# Patient Record
Sex: Female | Born: 2000 | Race: White | Hispanic: No | Marital: Single | Smoking: Never smoker
Health system: Southern US, Community
[De-identification: ages and names within clinical notes are randomized; demographics above are authoritative.]

## PROBLEM LIST (undated history)

## (undated) DIAGNOSIS — G43909 Migraine, unspecified, not intractable, without status migrainosus: Secondary | ICD-10-CM

## (undated) DIAGNOSIS — J309 Allergic rhinitis, unspecified: Secondary | ICD-10-CM

## (undated) DIAGNOSIS — N938 Other specified abnormal uterine and vaginal bleeding: Secondary | ICD-10-CM

## (undated) DIAGNOSIS — N946 Dysmenorrhea, unspecified: Secondary | ICD-10-CM

## (undated) DIAGNOSIS — J45991 Cough variant asthma: Secondary | ICD-10-CM

## (undated) DIAGNOSIS — Z8616 Personal history of COVID-19: Secondary | ICD-10-CM

## (undated) DIAGNOSIS — J45909 Unspecified asthma, uncomplicated: Secondary | ICD-10-CM

## (undated) DIAGNOSIS — G44209 Tension-type headache, unspecified, not intractable: Secondary | ICD-10-CM

## (undated) HISTORY — PX: OTHER SURGICAL HISTORY: SHX169

## (undated) HISTORY — DX: Personal history of COVID-19: Z86.16

## (undated) HISTORY — DX: Allergic rhinitis, unspecified: J30.9

## (undated) HISTORY — DX: Dysmenorrhea, unspecified: N94.6

## (undated) HISTORY — DX: Other specified abnormal uterine and vaginal bleeding: N93.8

## (undated) HISTORY — PX: TYMPANOSTOMY TUBE PLACEMENT: SHX32

## (undated) HISTORY — DX: Tension-type headache, unspecified, not intractable: G44.209

## (undated) HISTORY — DX: Migraine, unspecified, not intractable, without status migrainosus: G43.909

## (undated) HISTORY — DX: Cough variant asthma: J45.991

---
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
## 2021-01-20 DIAGNOSIS — G43719 Chronic migraine without aura, intractable, without status migrainosus: Secondary | ICD-10-CM | POA: Diagnosis not present

## 2021-01-20 DIAGNOSIS — Z79899 Other long term (current) drug therapy: Secondary | ICD-10-CM | POA: Diagnosis not present

## 2021-01-20 DIAGNOSIS — Z049 Encounter for examination and observation for unspecified reason: Secondary | ICD-10-CM | POA: Diagnosis not present

## 2021-02-12 ENCOUNTER — Encounter: Payer: Self-pay | Admitting: Neurology

## 2021-02-12 ENCOUNTER — Other Ambulatory Visit: Payer: Self-pay

## 2021-02-12 ENCOUNTER — Ambulatory Visit: Payer: Federal, State, Local not specified - PPO | Admitting: Neurology

## 2021-02-12 VITALS — BP 121/71 | HR 75 | Ht 67.5 in | Wt 165.3 lb

## 2021-02-12 DIAGNOSIS — G43019 Migraine without aura, intractable, without status migrainosus: Secondary | ICD-10-CM

## 2021-02-12 MED ORDER — NURTEC 75 MG PO TBDP: 75.0000 mg | ORAL_TABLET | Freq: Once | ORAL | 3 refills | Status: DC | PRN

## 2021-02-12 MED ORDER — NURTEC 75 MG PO TBDP: 75.0000 mg | ORAL_TABLET | ORAL | 0 refills | Status: DC | PRN

## 2021-02-12 MED ORDER — AMITRIPTYLINE HCL 25 MG PO TABS: 25.0000 mg | ORAL_TABLET | Freq: Every day | ORAL | 3 refills | Status: DC

## 2021-02-12 NOTE — Patient Instructions (Signed)
It was nice to meet you both today.  I do believe you have migraine headaches.    Please remember, common headache triggers are: sleep deprivation, dehydration, overheating, stress, hypoglycemia or skipping meals and blood sugar fluctuations, excessive pain medications or excessive alcohol use or caffeine withdrawal. Some people have food triggers such as aged cheese, orange juice or chocolate, especially dark chocolate, or MSG (monosodium glutamate). Try to avoid these headache triggers as much possible. It may be helpful to keep a headache diary to figure out what makes your headaches worse or brings them on and what alleviates them. Some people report headache onset after exercise but studies have shown that regular exercise may actually prevent headaches from coming. If you have exercise-induced headaches, please make sure that you drink plenty of fluid before and after exercising and that you do not over do it and do not overheat.  For migraine prevention, I would like to suggest: Elavil (generic name: amitriptyline) 25 mg: Take half a pill daily at bedtime for one week, then one pill daily at bedtime for one week, then one and a half pills daily at bedtime for one week, then 2 pills daily at bedtime thereafter. Common side effects reported are: mouth dryness, drowsiness, confusion, dizziness.   Please note that most antidepressant medications have a black box warning regarding worsening depression and suicidal ideations.  Please read the insert carefully and call us with any questions.  We will consider doing a brain MRI in the future.  Your neurological exam is normal.  I do agree with seeking a formal eye examination which is pending for you for June.    For migraine abortive/acute treatment, try the Nurtec samples.  I will prescribe this medication as well:  Nurtec 75 mg strength: Take 1 pill (dissolve under the tongue) at onset of migraine headache, may repeat in 2 hours, no more than 2 pills  in 24 hours. May cause sedation and nausea.   You should be able to use a co-pay card for this to bring the price down.  Please follow-up routinely in this clinic to see one of our nurse practitioners in 3 months, call or email through MyChart with any interim questions or concerns you may have.

## 2021-02-12 NOTE — Progress Notes (Signed)
Subjective:    Patient ID: Tonya Salazar is a 20 y.o. female.  HPI     Tonya Age, Tonya Salazar, Tonya Salazar [facility] [address] [place], [place] [address]  Dear Dr. Abner Greenspan,   I saw your patient, Tonya Salazar, upon your kind request in the neurologic clinic today for consultation and second opinion of her headaches, concern for migraines.  The patient is accompanied by her mother today.  As you think Tonya Salazar is an 20 year old female with an underlying medical history of allergic rhinitis, dysmenorrhea, perioral dermatitis, who reports an approximately 75-monthhistory of recurrent headaches which are primarily located in the back of her head, left or right side.  She has associated nausea, typically no vomiting and some light sensitivity, some sound sensitivity.  Headache is throbbing and ranges in severity, is currently almost daily.  She has been taking over-the-counter ibuprofen and Excedrin, sometimes 3 at a time and almost daily.  She had side effects from both Zonegran and baclofen, baclofen made her very drowsy even half a pill of the 10 mg strength which she took in the evening, she felt drowsy the next day and missed school.  She took Zonegran which was 25 mg strength and she was advised to titrate this up to 4 pills at night in weekly increments, she only took it for a week at the 25 mg strength and felt that her headaches became worse and that she was groggy from it.  She has since then discontinued these medications which were prescribed [facility].  She has no follow-up pending at this time or canceled it.  She has made an appointment for an eye examination which is pending for June.  At Dr. FKirstie Mirzaoffice her visual acuity was 20/20.  She has no family history of migraines.  She has no history of head injuries.  She does report school related stress, tries to sleep at least 7 hours, typically in bed by 11 and rise time  is around 7.  On the weekends she has to get up at 6 to go to work.  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  In fifth grade she had an injury to her right knee as she was bicycling but did not have a head injury.  She tries to hydrate well, estimates that she drinks at least 60 ounces of water, limits her caffeine to 1 cup in the morning of coffee.  She does not drink alcohol or smoke.  She lives with her family which includes parents and 3 sisters, none with migraines.  I reviewed your office records.  She was seen in a televisit on 12/31/2020 and I reviewed the note.  She reported a 33-week history of worsening headaches with nearly daily occurrence.  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  She was started on magnesium, riboflavin and coenzyme Q 10, encouraged to hydrate well and not miss any food or snacks.  She was referred to the headache wellness center and was seen and March.  She was started on zonisamide and baclofen as needed.  I was able to review the office visit note from 01/21/1999 2010 when she saw Dr. FDomingo Cocking  She was felt to have chronic migraines without aura, intractable, facial neuralgia, chronic facial pain, cervicalgia, spasm of muscle, medication overuse headache, myalgia.  Blood work was ordered including ESR,  lipid panel, liver profile and BMP.  I do not have those blood test results available for my review.   Her Past Medical History Is Significant For: Past Medical History:  Diagnosis Date  . Asthma     Her Past Surgical History Is Significant For: No past surgical history on file.  Her Family History Is Significant For: No family history on file.  Her Social History Is Significant For: Social History   Socioeconomic History  . Marital status: Single    Spouse name: Not on file  . Number of children: Not on file  . Years of education: Not on file  . Highest education level: Not on file  Occupational  History  . Not on file  Tobacco Use  . Smoking status: Never Smoker  . Smokeless tobacco: Never Used  Vaping Use  . Vaping Use: Never used  Substance and Sexual Activity  . Alcohol use: Never  . Drug use: Never  . Sexual activity: Not on file  Other Topics Concern  . Not on file  Social History Narrative  . Not on file   Social Determinants of Health   Financial Resource Strain: Not on file  Food Insecurity: Not on file  Transportation Needs: Not on file  Physical Activity: Not on file  Stress: Not on file  Social Connections: Not on file    Her Allergies Are:  No Known Allergies:   Her Current Medications Are:  Outpatient Encounter Medications as of 02/12/2021  Medication Sig  . albuterol (PROVENTIL HFA;VENTOLIN HFA) 108 (90 BASE) MCG/ACT inhaler Inhale 1-2 puffs into the lungs every 6 (six) hours as needed for wheezing or shortness of breath.  . beclomethasone (QVAR) 40 MCG/ACT inhaler Inhale 2 puffs into the lungs 2 (two) times daily.  Marland Kitchen co-enzyme Q-10 30 MG capsule Take 30 mg by mouth 3 (three) times daily.  Marland Kitchen desogestrel-ethinyl estradiol (APRI) 0.15-30 MG-MCG tablet   . levocetirizine (XYZAL) 5 MG tablet Take 2.5 mg by mouth every evening.  Marland Kitchen MAGNESIUM PO Take by mouth.  . minocycline (MINOCIN) 50 MG capsule TAKE 1 CAPSULE(50 MG) BY MOUTH DAILY  . montelukast (SINGULAIR) 5 MG chewable tablet Chew 5 mg by mouth at bedtime.  . Pyridoxine HCl (VITAMIN B6 PO) Take by mouth.  . Minocycline HCl Micronized (AMZEEQ) 4 % FOAM Apply 1 application topically daily. (Patient not taking: Reported on 02/12/2021)  . [DISCONTINUED] cetirizine (ZYRTEC) 5 MG tablet Take 5 mg by mouth daily. (Patient not taking: Reported on 02/12/2021)  . [DISCONTINUED] metroNIDAZOLE (METROGEL) 0.75 % gel Apply 1 application topically in the morning and at bedtime. (Patient not taking: Reported on 02/12/2021)   No facility-administered encounter medications on file as of 02/12/2021.  :   Review of  Systems:  Out of a complete 14 point review of systems, all are reviewed and negative with the exception of these symptoms as listed below: Review of Systems  Neurological:       Here for consult on worsening H/a. Pt repots almost daily h/a that starts in the base of her neck and will progress to the to the back of her head and settel. Pt reports some pain is associated in the frontal area, but not often. She has been seen by the headache wellness center, and zonegran 25 mg and baclofen 10. Pt reports the zonegran did not help and the baclofen made her drowsy. Pt reports when h/a are present associated, nausea, dizziness, blurry vision, floaters, and light/noise sensitivity are associated.  Objective:  Neurological Exam  Physical Exam Physical Examination:   There were no vitals filed for this visit.  General Examination: The patient is a very pleasant 20 y.o. female in no acute distress. She appears well-developed and well-nourished and well groomed.  Currently has a mild dull throbbing headache on the right parietal and posterior area, rates it a 3 out of 10.  HEENT: Normocephalic, atraumatic, pupils are equal, round and reactive to light and accommodation.  Not particularly photophobic.  Funduscopic exam is normal with sharp disc margins noted. Extraocular tracking is good without limitation to gaze excursion or nystagmus noted. Normal smooth pursuit is noted. Hearing is grossly intact. Face is symmetric with normal facial animation and normal facial sensation. Speech is clear with no dysarthria noted. There is no hypophonia. There is no lip, neck/head, jaw or voice tremor. Neck is supple with full range of passive and active motion. There are no carotid bruits on auscultation. Oropharynx exam reveals: no signif. mouth dryness, good dental hygiene. Tongue protrudes centrally and palate elevates symmetrically.   Chest: Clear to auscultation without wheezing, rhonchi or crackles  noted.  Heart: S1+S2+0, regular and normal without murmurs, rubs or gallops noted.   Abdomen: Soft, non-tender and non-distended with normal bowel sounds appreciated on auscultation.  Extremities: There is no pitting edema in the distal lower extremities bilaterally. Pedal pulses are intact.  Skin: Warm and dry without trophic changes noted.  Musculoskeletal: exam reveals no obvious joint deformities, tenderness or joint swelling or erythema.   Neurologically:  Mental status: The patient is awake, alert and oriented in all 4 spheres. Her immediate and remote memory, attention, language skills and fund of knowledge are appropriate. There is no evidence of aphasia, agnosia, apraxia or anomia. Speech is clear with normal prosody and enunciation. Thought process is linear. Mood is normal and affect is normal.  Cranial nerves II - XII are as described above under HEENT exam. In addition: shoulder shrug is normal with equal shoulder height noted. Motor exam: Normal bulk, strength and tone is noted. There is no drift, tremor or rebound. Romberg is negative. Reflexes are 2+ throughout. Babinski: Toes are flexor bilaterally. Fine motor skills and coordination: intact with normal finger taps, normal hand movements, normal rapid alternating patting, normal foot taps and normal foot agility.  Cerebellar testing: No dysmetria or intention tremor on finger to nose testing. Heel to shin is unremarkable bilaterally. There is no truncal or gait ataxia.  Sensory exam: intact to light touch, vibration, temperature sense in the upper and lower extremities.  Gait, station and balance: She stands easily. No veering to one side is noted. No leaning to one side is noted. Posture is Salazar-appropriate and stance is narrow based. Gait shows normal stride length and normal pace. No problems turning are noted. Tandem walk is unremarkable.                Assessment and Plan:   In summary, Barbaraann Avans is a very pleasant 20  y.o.-year old female with an underlying medical history of allergic rhinitis, dysmenorrhea, perioral dermatitis, who presents for evaluation of her recurrent headaches of several months duration.  History and description are most likely in keeping with migraines without aura.  She has had a recurrent, nearly daily headache recently.  She is discouraged from using over-the-counter medication on a daily basis to avoid headache perpetuation from medication overuse.  She had recently tried Zonegran and baclofen, both caused side effects.  We talked about headache management  including triggers, preventative and acute medication options, there are very many options.  We have to be selected given that she is young and a female.  We will consider a brain MRI in the near future to have 1 at baseline.  Her neurological exam is nonfocal and I agree with evaluation with an eye doctor to start make sure her eye exam is good as well.  For preventative treatment I would like for her to start low-dose amitriptyline 25 mg strength with half a pill at bedtime.  We talked about expectations, common side effects and black box warning of antidepressants.  For as needed use, for an acute migraine attack I would like for her to try Nurtec 75 mg ODT.  She is advised how to take this medicine and the timing of it.  She can repeat after 2 hours.  She is given samples today.  She was provided detailed written instructions about both prescriptions and we talked about the importance of healthy lifestyle, good nutrition, getting enough rest, getting enough sleep.  Of note, she can continue with her supplements.  She is advised to follow-up routinely in this clinic in 3 months, sooner if needed.  She is encouraged to call us with any interim questions or concerns or email [place] through [place].  She is reassured that her neurological exam is normal.  I answered all the questions today and the patient and her mother were in agreement.  Thank you  very much for allowing me to participate in the care of this nice patient. If I can be of any further assistance to you please do not hesitate to call me at [phone].  Sincerely,   Tonya Age, Tonya Salazar, Tonya Salazar

## 2021-02-20 ENCOUNTER — Telehealth: Payer: Self-pay

## 2021-02-20 NOTE — Telephone Encounter (Signed)
PA for Nurtec has been sent to [facility].  (Key: [id number])  Your information has been submitted to [employer]. To check for an updated outcome later, reopen this PA request from your dashboard.  If [employer] has not responded to your request within 24 hours, contact [employer] at [phone]. If you think there may be a problem with your PA request, use our live chat feature at the bottom right.

## 2021-03-05 NOTE — Telephone Encounter (Signed)
Pa for nurtec has been denied due to the fact that the pt has not tired/failet 2 triptans.  Pt was given co-pay assist at the time of the visit. Will continue to use for now.

## 2021-03-19 DIAGNOSIS — U071 COVID-19: Secondary | ICD-10-CM | POA: Diagnosis not present

## 2021-03-19 DIAGNOSIS — R059 Cough, unspecified: Secondary | ICD-10-CM | POA: Diagnosis not present

## 2021-03-28 ENCOUNTER — Other Ambulatory Visit: Payer: Self-pay | Admitting: Dermatology

## 2021-04-01 ENCOUNTER — Encounter: Payer: Self-pay | Admitting: Neurology

## 2021-04-01 DIAGNOSIS — G43019 Migraine without aura, intractable, without status migrainosus: Secondary | ICD-10-CM

## 2021-04-01 MED ORDER — AMITRIPTYLINE HCL 25 MG PO TABS: ORAL_TABLET | ORAL | 3 refills | Status: DC

## 2021-04-03 ENCOUNTER — Ambulatory Visit: Payer: Federal, State, Local not specified - PPO | Admitting: Neurology

## 2021-04-23 DIAGNOSIS — Z3041 Encounter for surveillance of contraceptive pills: Secondary | ICD-10-CM | POA: Diagnosis not present

## 2021-04-23 DIAGNOSIS — N938 Other specified abnormal uterine and vaginal bleeding: Secondary | ICD-10-CM | POA: Diagnosis not present

## 2021-04-23 DIAGNOSIS — N946 Dysmenorrhea, unspecified: Secondary | ICD-10-CM | POA: Diagnosis not present

## 2021-04-23 DIAGNOSIS — Z113 Encounter for screening for infections with a predominantly sexual mode of transmission: Secondary | ICD-10-CM | POA: Diagnosis not present

## 2021-04-28 DIAGNOSIS — H53451 Other localized visual field defect, right eye: Secondary | ICD-10-CM | POA: Diagnosis not present

## 2021-04-28 DIAGNOSIS — H5212 Myopia, left eye: Secondary | ICD-10-CM | POA: Diagnosis not present

## 2021-05-06 ENCOUNTER — Other Ambulatory Visit: Payer: Self-pay

## 2021-05-06 ENCOUNTER — Encounter: Payer: Self-pay | Admitting: Adult Health

## 2021-05-06 ENCOUNTER — Ambulatory Visit: Payer: Federal, State, Local not specified - PPO | Admitting: Adult Health

## 2021-05-06 DIAGNOSIS — G43019 Migraine without aura, intractable, without status migrainosus: Secondary | ICD-10-CM | POA: Diagnosis not present

## 2021-05-06 MED ORDER — AMITRIPTYLINE HCL 50 MG PO TABS: ORAL_TABLET | ORAL | 3 refills | Status: DC

## 2021-05-06 NOTE — Progress Notes (Addendum)
PATIENT: Tonya Salazar DOB: September 20, 2001  REASON FOR VISIT: follow up HISTORY FROM: patient PRIMARY NEUROLOGIST: Dr. Rexene Alberts  HISTORY OF PRESENT ILLNESS: Today 05/06/21:  Tonya Salazar is an 20 year old female with a history of migraine headaches.  She returns today for follow-up.  At the last visit Dr. Rexene Alberts started the patient on amitriptyline 50 mg at bedtime.  She reports that this is working well.  She has approximately 1 headache a week.  She typically can take ibuprofen and the headache resolves within 15 minutes.  She was unable to get Nurtec as her insurance did not cover it.  She returns today for an evaluation.    HISTORY (copied from Dr. Guadelupe Sabin note) Tonya Salazar is an 20 year old female with an underlying medical history of allergic rhinitis, dysmenorrhea, perioral dermatitis, who reports an approximately 5-monthhistory of recurrent headaches which are primarily located in the back of her head, left or right side.  She has associated nausea, typically no vomiting and some light sensitivity, some sound sensitivity.  Headache is throbbing and ranges in severity, is currently almost daily.  She has been taking over-the-counter ibuprofen and Excedrin, sometimes 3 at a time and almost daily.  She had side effects from both Zonegran and baclofen, baclofen made her very drowsy even half a pill of the 10 mg strength which she took in the evening, she felt drowsy the next day and missed school.  She took Zonegran which was 25 mg strength and she was advised to titrate this up to 4 pills at night in weekly increments, she only took it for a week at the 25 mg strength and felt that her headaches became worse and that she was groggy from it.  She has since then discontinued these medications which were prescribed by Dr[facility]ingo [facility]at the [facility].  She has no follow-up pending at this time or canceled it.  She has made an appointment for an eye examination which is pending for June.  At  Dr. FKirstie Mirzaoffice her visual acuity was 20/20.   She has no family history of migraines.  She has no history of head injuries.  She does report school related stress, tries to sleep at least 7 hours, typically in bed by 11 and rise time is around 7.  On the weekends she has to get up at 6 to go to work.  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  In fifth grade she had an injury to her right knee as she was bicycling but did not have a head injury.  She tries to hydrate well, estimates that she drinks at least 60 ounces of water, limits her caffeine to 1 cup in the morning of coffee.  She does not drink alcohol or smoke.  She lives with her family which includes parents and 3 sisters, none with migraines.   I reviewed your office records.  She was seen in a televisit on 12/31/2020 and I reviewed the note.  She reported a 33-week history of worsening headaches with nearly daily occurrence.  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  She was started on magnesium, riboflavin and coenzyme Q 10, encouraged to hydrate well and not miss any food or snacks.  She was referred to the headache wellness center and was seen and March.  She was started on zonisamide and baclofen as needed.  I was able to review the office  visit note from 01/21/1999 2010 when she saw Dr. Domingo Cocking.  She was felt to have chronic migraines without aura, intractable, facial neuralgia, chronic facial pain, cervicalgia, spasm of muscle, medication overuse headache, myalgia.  Blood work was ordered including ESR, lipid panel, liver profile and BMP.  I do not have those blood test results available for my review.  REVIEW OF SYSTEMS: Out of a complete 14 system review of symptoms, the patient complains only of the following symptoms, and all other reviewed systems are negative.  See HPI  ALLERGIES: No Known Allergies  HOME MEDICATIONS: Outpatient Medications Prior to Visit  Medication  Sig Dispense Refill   albuterol (PROVENTIL HFA;VENTOLIN HFA) 108 (90 BASE) MCG/ACT inhaler Inhale 1-2 puffs into the lungs every 6 (six) hours as needed for wheezing or shortness of breath.     amitriptyline (ELAVIL) 25 MG tablet Take 2 tablets at bedtime. 60 tablet 3   beclomethasone (QVAR) 40 MCG/ACT inhaler Inhale 2 puffs into the lungs 2 (two) times daily.     co-enzyme Q-10 30 MG capsule Take 30 mg by mouth 3 (three) times daily.     desogestrel-ethinyl estradiol (APRI) 0.15-30 MG-MCG tablet      levocetirizine (XYZAL) 5 MG tablet Take 2.5 mg by mouth every evening.     MAGNESIUM PO Take by mouth.     minocycline (MINOCIN) 50 MG capsule TAKE 1 CAPSULE(50 MG) BY MOUTH DAILY 50 capsule 1   Minocycline HCl Micronized (AMZEEQ) 4 % FOAM Apply 1 application topically daily. 30 g 2   montelukast (SINGULAIR) 5 MG chewable tablet Chew 5 mg by mouth at bedtime.     Pyridoxine HCl (VITAMIN B6 PO) Take by mouth.     Rimegepant Sulfate (NURTEC) 75 MG TBDP Take 75 mg by mouth once as needed for up to 1 dose. May repeat once after 2 hours 10 tablet 3   Rimegepant Sulfate (NURTEC) 75 MG TBDP Take 75 mg by mouth as needed. 6 tablet 0   No facility-administered medications prior to visit.    PAST MEDICAL HISTORY: Past Medical History:  Diagnosis Date   Asthma     PAST SURGICAL HISTORY: No past surgical history on file.  FAMILY HISTORY: No family history on file.  SOCIAL HISTORY: Social History   Socioeconomic History   Marital status: Single    Spouse name: Not on file   Number of children: Not on file   Years of education: Not on file   Highest education level: Not on file  Occupational History   Not on file  Tobacco Use   Smoking status: Never   Smokeless tobacco: Never  Vaping Use   Vaping Use: Never used  Substance and Sexual Activity   Alcohol use: Never   Drug use: Never   Sexual activity: Not on file  Other Topics Concern   Not on file  Social History Narrative   Not on  file   Social Determinants of Health   Financial Resource Strain: Not on file  Food Insecurity: Not on file  Transportation Needs: Not on file  Physical Activity: Not on file  Stress: Not on file  Social Connections: Not on file  Intimate Partner Violence: Not on file      PHYSICAL EXAM  Vitals:   05/06/21 0924  BP: 134/76  Pulse: 84  Weight: 170 lb (77.1 kg)  Height: _0  (1.676 m)   Body mass index is 27.44 kg/m.  Generalized: Well developed, in no acute distress  Neurological examination  Mentation: Alert oriented to time, place, history taking. Follows all commands speech and language fluent Cranial nerve II-XII: Pupils were equal round reactive to light. Extraocular movements were full, visual field were full on confrontational test. Facial sensation and strength were normal. Uvula tongue midline. Head turning and shoulder shrug  were normal and symmetric. Motor: The motor testing reveals 5 over 5 strength of all 4 extremities. Good symmetric motor tone is noted throughout.  Sensory: Sensory testing is intact to soft touch on all 4 extremities. No evidence of extinction is noted.  Coordination: Cerebellar testing reveals good finger-nose-finger and heel-to-shin bilaterally.  Gait and station: Gait is normal. Tandem gait is normal. Romberg is negative. No drift is seen.  Reflexes: Deep tendon reflexes are symmetric and normal bilaterally.   DIAGNOSTIC DATA (LABS, IMAGING, TESTING) - I reviewed patient records, labs, notes, testing and imaging myself where available.     ASSESSMENT AND PLAN 20 y.o. year old female  has a past medical history of Asthma. here with:  1.  Migraine headaches  --Prevention: Amitriptyline 50 mg at bedtime --Abortive: Okay to use over-the-counter ibuprofen --Cautioned patient to not use ibuprofen on a daily basis as it can cause rebound headaches. --Advised that if her headache frequency increases she should let us know. --Follow-up  in 6 months or sooner if needed     Ward Givens, MSN, NP-C 05/06/2021, 9:29 AM [facility] [address] [place], [place] [address] [phone]  I reviewed the above note and documentation by the Nurse Practitioner and agree with the history, exam, assessment and plan as outlined above. I was available for consultation. Star Age, MD, PhD [facility])

## 2021-05-06 NOTE — Patient Instructions (Addendum)
Your Plan:  Continue Amitriptyline 50 mg at bedtime Continue to Ibuprofen   Thank you for coming to see [place] at [facility]. I hope we have been able to provide you high quality care today.  You may receive a patient satisfaction survey over the next few weeks. We would appreciate your feedback and comments so that we may continue to improve ourselves and the health of our patients.

## 2021-05-14 ENCOUNTER — Ambulatory Visit: Payer: Federal, State, Local not specified - PPO | Admitting: Family Medicine

## 2021-05-19 DIAGNOSIS — Z3041 Encounter for surveillance of contraceptive pills: Secondary | ICD-10-CM | POA: Diagnosis not present

## 2021-07-08 ENCOUNTER — Other Ambulatory Visit: Payer: Self-pay

## 2021-07-08 ENCOUNTER — Encounter: Payer: Self-pay | Admitting: Dermatology

## 2021-07-08 ENCOUNTER — Ambulatory Visit: Payer: Federal, State, Local not specified - PPO | Admitting: Physician Assistant

## 2021-07-08 DIAGNOSIS — L710 Perioral dermatitis: Secondary | ICD-10-CM | POA: Diagnosis not present

## 2021-07-08 MED ORDER — MINOCYCLINE HCL 50 MG PO CAPS: 50.0000 mg | ORAL_CAPSULE | Freq: Two times a day (BID) | ORAL | 11 refills | Status: DC

## 2021-07-09 ENCOUNTER — Encounter: Payer: Self-pay | Admitting: Physician Assistant

## 2021-07-09 NOTE — Progress Notes (Signed)
   Follow-Up Visit   Subjective  Tonya Salazar is a 20 y.o. female who presents for the following: Acne (Refill on minocycline ).   The following portions of the chart were reviewed this encounter and updated as appropriate:  Tobacco  Allergies  Meds  Problems  Med Hx  Surg Hx  Fam Hx      Objective  Well appearing patient in no apparent distress; mood and affect are within normal limits.  A focused examination was performed including face. Relevant physical exam findings are noted in the Assessment and Plan.  Left Buccal Cheek, Mid Lower Cutaneous Lip, Right Buccal Cheek Clear with Minocycline treatment.   Assessment & Plan  Perioral dermatitis Mid Lower Cutaneous Lip; Left Buccal Cheek; Right Buccal Cheek  minocycline (MINOCIN) 50 MG capsule - Left Buccal Cheek, Mid Lower Cutaneous Lip, Right Buccal Cheek Take 1 capsule (50 mg total) by mouth 2 (two) times daily.    I, Mikiah Durall, PA-C, have reviewed all documentation's for this visit.  The documentation on 07/09/21 for the exam, diagnosis, procedures and orders are all accurate and complete.

## 2021-07-28 DIAGNOSIS — J069 Acute upper respiratory infection, unspecified: Secondary | ICD-10-CM | POA: Diagnosis not present

## 2021-07-28 DIAGNOSIS — J029 Acute pharyngitis, unspecified: Secondary | ICD-10-CM | POA: Diagnosis not present

## 2021-07-28 DIAGNOSIS — Z20822 Contact with and (suspected) exposure to covid-19: Secondary | ICD-10-CM | POA: Diagnosis not present

## 2021-07-28 DIAGNOSIS — R509 Fever, unspecified: Secondary | ICD-10-CM | POA: Diagnosis not present

## 2021-07-28 DIAGNOSIS — R059 Cough, unspecified: Secondary | ICD-10-CM | POA: Diagnosis not present

## 2021-10-13 DIAGNOSIS — N938 Other specified abnormal uterine and vaginal bleeding: Secondary | ICD-10-CM | POA: Diagnosis not present

## 2021-10-13 DIAGNOSIS — J45991 Cough variant asthma: Secondary | ICD-10-CM | POA: Diagnosis not present

## 2021-10-13 DIAGNOSIS — J309 Allergic rhinitis, unspecified: Secondary | ICD-10-CM | POA: Diagnosis not present

## 2021-10-20 ENCOUNTER — Ambulatory Visit: Payer: Federal, State, Local not specified - PPO | Admitting: Dermatology

## 2021-10-20 ENCOUNTER — Other Ambulatory Visit: Payer: Self-pay

## 2021-10-20 ENCOUNTER — Encounter: Payer: Self-pay | Admitting: Dermatology

## 2021-10-20 DIAGNOSIS — L700 Acne vulgaris: Secondary | ICD-10-CM

## 2021-10-20 MED ORDER — ARAZLO 0.045 % EX LOTN: 1.0000 "application " | TOPICAL_LOTION | Freq: Every evening | CUTANEOUS | 3 refills | Status: DC

## 2021-10-20 MED ORDER — DAPSONE 5 % EX GEL: 1.0000 "application " | Freq: Two times a day (BID) | CUTANEOUS | 3 refills | Status: DC

## 2021-10-20 NOTE — Patient Instructions (Signed)
Patient will alternate her rx creams. One day Aczone, next day dapsone (Aczone)

## 2021-10-20 NOTE — Progress Notes (Signed)
Tonya Salazar Tonya Salazar - PA Case ID: [id number] - Rx #: [id number] help? Call us at [phone] Outcome Approvedtoday Your PA request has been approved. Additional information will be provided in the approval communication. (Message 1145) Drug Dapsone 5% gel Form Ambulance person PA Form (2017 NCPDP) Original Claim Info 75 FOR OVERRIDE HAVE MD CALL [phone]  Prior authorization approved for dapsone 5%.

## 2021-11-12 ENCOUNTER — Encounter: Payer: Self-pay | Admitting: Dermatology

## 2021-11-13 NOTE — Progress Notes (Signed)
° °  Follow-Up Visit   Subjective  Tonya Salazar is a 21 y.o. female who presents for the following: Acne (Patient here today for acne flare up x 4 months per patient she's been taking MCN which worked in the past but now it's not helping as much. Per patient she noticed the flare once she got to [facility] in August. ).  Acne flaring Location:  Duration:  Quality:  Associated Signs/Symptoms: Modifying Factors:  Severity:  Timing: Context:   Objective  Well appearing patient in no apparent distress; mood and affect are within normal limits. Head - Anterior (Face) Mixed comedonal plus inflammatory acne, predominantly facial, predominantly superficial.  All treatment options again discussed including isotretinoin.  She is comfortable with trying a more aggressive topical approach for 6 to 8 weeks; follow-up at that time    Head and neck examined today.   Assessment & Plan    Acne vulgaris Head - Anterior (Face)  Arazlo plus aczone, initally alternate nightly.  If not too irritated in 2 to 3 weeks may use each daily.  Tazarotene (ARAZLO) 0.045 % LOTN - Head - Anterior (Face) Apply 1 application topically at bedtime.  Dapsone (ACZONE) 5 % topical gel - Head - Anterior (Face) Apply 1 application topically 2 (two) times daily.      I, Janalyn Harder, MD, have reviewed all documentation for this visit.  The documentation on 11/13/21 for the exam, diagnosis, procedures, and orders are all accurate and complete.

## 2021-11-22 DIAGNOSIS — Z8709 Personal history of other diseases of the respiratory system: Secondary | ICD-10-CM | POA: Diagnosis not present

## 2021-11-22 DIAGNOSIS — J029 Acute pharyngitis, unspecified: Secondary | ICD-10-CM | POA: Diagnosis not present

## 2021-11-22 DIAGNOSIS — Z20822 Contact with and (suspected) exposure to covid-19: Secondary | ICD-10-CM | POA: Diagnosis not present

## 2021-11-22 DIAGNOSIS — R051 Acute cough: Secondary | ICD-10-CM | POA: Diagnosis not present

## 2021-12-04 ENCOUNTER — Encounter: Payer: Self-pay | Admitting: Adult Health

## 2021-12-04 DIAGNOSIS — J4540 Moderate persistent asthma, uncomplicated: Secondary | ICD-10-CM | POA: Diagnosis not present

## 2021-12-04 DIAGNOSIS — G43019 Migraine without aura, intractable, without status migrainosus: Secondary | ICD-10-CM

## 2021-12-04 MED ORDER — AMITRIPTYLINE HCL 50 MG PO TABS: ORAL_TABLET | ORAL | 1 refills | Status: DC

## 2021-12-12 DIAGNOSIS — Z713 Dietary counseling and surveillance: Secondary | ICD-10-CM | POA: Diagnosis not present

## 2021-12-23 DIAGNOSIS — Z713 Dietary counseling and surveillance: Secondary | ICD-10-CM | POA: Diagnosis not present

## 2022-01-01 DIAGNOSIS — Z713 Dietary counseling and surveillance: Secondary | ICD-10-CM | POA: Diagnosis not present

## 2022-01-13 DIAGNOSIS — J22 Unspecified acute lower respiratory infection: Secondary | ICD-10-CM | POA: Diagnosis not present

## 2022-01-13 DIAGNOSIS — Z20822 Contact with and (suspected) exposure to covid-19: Secondary | ICD-10-CM | POA: Diagnosis not present

## 2022-01-13 DIAGNOSIS — R051 Acute cough: Secondary | ICD-10-CM | POA: Diagnosis not present

## 2022-03-16 DIAGNOSIS — F4010 Social phobia, unspecified: Secondary | ICD-10-CM | POA: Diagnosis not present

## 2022-04-01 DIAGNOSIS — J020 Streptococcal pharyngitis: Secondary | ICD-10-CM | POA: Diagnosis not present

## 2022-04-01 DIAGNOSIS — R509 Fever, unspecified: Secondary | ICD-10-CM | POA: Diagnosis not present

## 2022-04-01 DIAGNOSIS — F4010 Social phobia, unspecified: Secondary | ICD-10-CM | POA: Diagnosis not present

## 2022-04-02 ENCOUNTER — Other Ambulatory Visit: Payer: Self-pay | Admitting: Neurology

## 2022-04-02 DIAGNOSIS — G43019 Migraine without aura, intractable, without status migrainosus: Secondary | ICD-10-CM

## 2022-04-08 ENCOUNTER — Encounter: Payer: Self-pay | Admitting: Adult Health

## 2022-04-08 DIAGNOSIS — G43019 Migraine without aura, intractable, without status migrainosus: Secondary | ICD-10-CM

## 2022-04-08 MED ORDER — AMITRIPTYLINE HCL 50 MG PO TABS: ORAL_TABLET | ORAL | 0 refills | Status: DC

## 2022-05-04 DIAGNOSIS — J40 Bronchitis, not specified as acute or chronic: Secondary | ICD-10-CM | POA: Diagnosis not present

## 2022-05-11 ENCOUNTER — Ambulatory Visit: Payer: Federal, State, Local not specified - PPO | Admitting: Adult Health

## 2022-05-11 ENCOUNTER — Encounter: Payer: Self-pay | Admitting: Adult Health

## 2022-05-11 VITALS — BP 127/74 | HR 93 | Ht 67.0 in | Wt 187.0 lb

## 2022-05-11 DIAGNOSIS — G43709 Chronic migraine without aura, not intractable, without status migrainosus: Secondary | ICD-10-CM | POA: Diagnosis not present

## 2022-05-11 MED ORDER — RIZATRIPTAN BENZOATE 10 MG PO TABS: ORAL_TABLET | ORAL | 5 refills | Status: DC

## 2022-05-11 NOTE — Patient Instructions (Signed)
Your Plan:  Continue amitriptyline  Start Maxalt to be taken at the onset of migraine. Can repeat in 2 hours if needed. Only 2 tabs/ 24 hours. If your symptoms worsen or you develop new symptoms please let us know.   Thank you for coming to see [place] at [facility]. I hope we have been able to provide you high quality care today.  You may receive a patient satisfaction survey over the next few weeks. We would appreciate your feedback and comments so that we may continue to improve ourselves and the health of our patients.

## 2022-05-11 NOTE — Progress Notes (Signed)
PATIENT: Tonya Salazar DOB: [birth year]  REASON FOR VISIT: follow up HISTORY FROM: patient PRIMARY NEUROLOGIST: Dr. Rexene Alberts  HISTORY OF PRESENT ILLNESS: Today 05/11/22:  Tonya Salazar is a 21 year old female with a history of migraines. She returns today for follow-up. Continues on Amitriptyline 50 mg at bedtime. She reports that she has approximately 1-2 headaches a month.  She typically uses Tylenol but it does not always resolve the headache.  Has never tried Imitrex or Maxalt.  Nurtec was ordered for her in the past but was not approved through her insurance because she has not tried a triptan medication.  She returns today for an evaluation.  7/5/2022Ms. Salazar is an 21 year old female female with a history of migraine headaches.  She returns today for follow-up.  At the last visit Dr. Rexene Alberts started the patient on amitriptyline 50 mg at bedtime.  She reports that this is working well.  She has approximately 1 headache a week.  She typically can take ibuprofen and the headache resolves within 15 minutes.  She was unable to get Nurtec as her insurance did not cover it.  She returns today for an evaluation.    HISTORY (copied from Dr. Guadelupe Sabin note) Tonya Salazar is an 21 year old female with an underlying medical history of allergic rhinitis, dysmenorrhea, perioral dermatitis, who reports an approximately 39-monthhistory of recurrent headaches which are primarily located in the back of her head, left or right side.  She has associated nausea, typically no vomiting and some light sensitivity, some sound sensitivity.  Headache is throbbing and ranges in severity, is currently almost daily.  She has been taking over-the-counter ibuprofen and Excedrin, sometimes 3 at a time and almost daily.  She had side effects from both Zonegran and baclofen, baclofen made her very drowsy even half a pill of the 10 mg strength which she took in the evening, she felt drowsy the next day and missed school.  She took Zonegran  which was 25 mg strength and she was advised to titrate this up to 4 pills at night in weekly increments, she only took it for a week at the 25 mg strength and felt that her headaches became worse and that she was groggy from it.  She has since then discontinued these medications which were prescribed by [facility]ingo [facility].  She has no follow-up pending at this time or canceled it.  She has made an appointment for an eye examination which is pending for June.  At Dr. FKirstie Mirzaoffice her visual acuity was 20/20.   She has no family history of migraines.  She has no history of head injuries.  She does report school related stress, tries to sleep at least 7 hours, typically in bed by 11 and rise time is around 7.  On the weekends she has to get up at 6 to go to work.  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  In fifth grade she had an injury to her right knee as she was bicycling but did not have a head injury.  She tries to hydrate well, estimates that she drinks at least 60 ounces of water, limits her caffeine to 1 cup in the morning of coffee.  She does not drink alcohol or smoke.  She lives with her family which includes parents and 3 sisters, none with migraines.   I reviewed your office records.  She was seen in a televisit on 12/31/2020  and I reviewed the note.  She reported a 33-week history of worsening headaches with nearly daily occurrence.  She did report increase in stress as she is a Equities trader in high school.  She was started on magnesium, riboflavin and coenzyme Q 10, encouraged to hydrate well and not miss any food or snacks.  She was referred to the headache wellness center and was seen and March.  She was started on zonisamide and baclofen as needed.  I was able to review the office visit note from 01/21/1999 2010 when she saw Dr. Domingo Cocking.  She was felt to have chronic migraines without aura, intractable, facial neuralgia, chronic facial pain,  cervicalgia, spasm of muscle, medication overuse headache, myalgia.  Blood work was ordered including ESR, lipid panel, liver profile and BMP.  I do not have those blood test results available for my review.  REVIEW OF SYSTEMS: Out of a complete 14 system review of symptoms, the patient complains only of the following symptoms, and all other reviewed systems are negative.  See HPI  ALLERGIES: No Known Allergies  HOME MEDICATIONS: Outpatient Medications Prior to Visit  Medication Sig Dispense Refill   ADVAIR HFA 115-21 MCG/ACT inhaler Inhale 2 puffs into the lungs 2 (two) times daily.     albuterol (PROVENTIL HFA;VENTOLIN HFA) 108 (90 BASE) MCG/ACT inhaler Inhale 1-2 puffs into the lungs every 6 (six) hours as needed for wheezing or shortness of breath.     amitriptyline (ELAVIL) 50 MG tablet Take 1 tablet at bedtime. 90 tablet 0   co-enzyme Q-10 30 MG capsule Take 30 mg by mouth 3 (three) times daily.     Dapsone (ACZONE) 5 % topical gel Apply 1 application topically 2 (two) times daily. 30 g 3   desogestrel-ethinyl estradiol (APRI) 0.15-30 MG-MCG tablet      fluticasone (FLONASE) 50 MCG/ACT nasal spray Place into both nostrils.     levocetirizine (XYZAL) 5 MG tablet Take 2.5 mg by mouth every evening.     MAGNESIUM PO Take by mouth.     montelukast (SINGULAIR) 5 MG chewable tablet Chew 5 mg by mouth at bedtime.     Pyridoxine HCl (VITAMIN B6 PO) Take by mouth.     Tazarotene (ARAZLO) 0.045 % LOTN Apply 1 application topically at bedtime. 60 g 3   beclomethasone (QVAR) 40 MCG/ACT inhaler Inhale 2 puffs into the lungs 2 (two) times daily. (Patient not taking: Reported on 05/11/2022)     minocycline (MINOCIN) 50 MG capsule Take 1 capsule (50 mg total) by mouth 2 (two) times daily. (Patient not taking: Reported on 05/11/2022) 60 capsule 11   No facility-administered medications prior to visit.    PAST MEDICAL HISTORY: Past Medical History:  Diagnosis Date   Asthma     PAST SURGICAL  HISTORY: History reviewed. No pertinent surgical history.  FAMILY HISTORY: Family History  Problem Relation Age of Onset   Diabetes Mother    Migraines Neg Hx     SOCIAL HISTORY: Social History   Socioeconomic History   Marital status: Single    Spouse name: Not on file   Number of children: Not on file   Years of education: Not on file   Highest education level: Not on file  Occupational History   Not on file  Tobacco Use   Smoking status: Never   Smokeless tobacco: Never  Vaping Use   Vaping Use: Never used  Substance and Sexual Activity   Alcohol use: Yes    Comment: sometimes  Drug use: Never   Sexual activity: Not on file  Other Topics Concern   Not on file  Social History Narrative   Going into sophomore year at [employer] fall 2023   Right handed   Caffeine: 1 cup coffee/day   Social Determinants of Health   Financial Resource Strain: Not on file  Food Insecurity: Not on file  Transportation Needs: Not on file  Physical Activity: Not on file  Stress: Not on file  Social Connections: Not on file  Intimate Partner Violence: Not on file      PHYSICAL EXAM  Vitals:   05/11/22 0925  BP: 127/74  Pulse: 93  Weight: 187 lb (84.8 kg)  Height: 5' 7"  (1.702 m)   Body mass index is 29.29 kg/m.  Generalized: Well developed, in no acute distress   Neurological examination  Mentation: Alert oriented to time, place, history taking. Follows all commands speech and language fluent Cranial nerve II-XII: Pupils were equal round reactive to light. Extraocular movements were full, visual field were full on confrontational test. Facial sensation and strength were normal. Uvula tongue midline. Head turning and shoulder shrug  were normal and symmetric. Motor: The motor testing reveals 5 over 5 strength of all 4 extremities. Good symmetric motor tone is noted throughout.  Sensory: Sensory testing is intact to soft touch on all 4 extremities. No evidence of extinction is  noted.  Coordination: Cerebellar testing reveals good finger-nose-finger and heel-to-shin bilaterally.  Gait and station: Gait is normal. Tandem gait is normal. Romberg is negative. No drift is seen.  Reflexes: Deep tendon reflexes are symmetric and normal bilaterally.   DIAGNOSTIC DATA (LABS, IMAGING, TESTING) - I reviewed patient records, labs, notes, testing and imaging myself where available.     ASSESSMENT AND PLAN 21 y.o. year old female  has a past medical history of Asthma. here with:  1.  Migraine headaches  --Prevention: Amitriptyline 50 mg at bedtime --Abortive: Try Maxalt 10 mg at the onset of a migraine repeat in 2 hours if needed--reviewed medication side effects with the patient and provided her with information on her MyChart account --Advised that if her headache frequency increases she should let us know. --Follow-up in 1 year or sooner if needed     Ward Givens, MSN, NP-C 05/11/2022, 9:45 AM [facility] [address], [place], [place] [address] [phone]

## 2022-06-01 DIAGNOSIS — Z1389 Encounter for screening for other disorder: Secondary | ICD-10-CM | POA: Diagnosis not present

## 2022-06-01 DIAGNOSIS — Z3041 Encounter for surveillance of contraceptive pills: Secondary | ICD-10-CM | POA: Diagnosis not present

## 2022-06-01 DIAGNOSIS — Z130 Encounter for screening for diseases of the blood and blood-forming organs and certain disorders involving the immune mechanism: Secondary | ICD-10-CM | POA: Diagnosis not present

## 2022-06-01 DIAGNOSIS — Z113 Encounter for screening for infections with a predominantly sexual mode of transmission: Secondary | ICD-10-CM | POA: Diagnosis not present

## 2022-06-01 DIAGNOSIS — Z01419 Encounter for gynecological examination (general) (routine) without abnormal findings: Secondary | ICD-10-CM | POA: Diagnosis not present

## 2022-07-08 ENCOUNTER — Ambulatory Visit: Payer: Federal, State, Local not specified - PPO | Admitting: Dermatology

## 2022-07-12 ENCOUNTER — Encounter: Payer: Self-pay | Admitting: Adult Health

## 2022-07-12 DIAGNOSIS — G43019 Migraine without aura, intractable, without status migrainosus: Secondary | ICD-10-CM

## 2022-07-13 MED ORDER — AMITRIPTYLINE HCL 50 MG PO TABS: ORAL_TABLET | ORAL | 1 refills | Status: DC

## 2022-07-27 DIAGNOSIS — H1045 Other chronic allergic conjunctivitis: Secondary | ICD-10-CM | POA: Diagnosis not present

## 2022-07-27 DIAGNOSIS — J4540 Moderate persistent asthma, uncomplicated: Secondary | ICD-10-CM | POA: Diagnosis not present

## 2022-07-27 DIAGNOSIS — J309 Allergic rhinitis, unspecified: Secondary | ICD-10-CM | POA: Diagnosis not present

## 2022-07-27 DIAGNOSIS — R21 Rash and other nonspecific skin eruption: Secondary | ICD-10-CM | POA: Diagnosis not present

## 2022-07-27 DIAGNOSIS — J45998 Other asthma: Secondary | ICD-10-CM | POA: Diagnosis not present

## 2022-09-23 ENCOUNTER — Ambulatory Visit: Payer: Federal, State, Local not specified - PPO | Admitting: Allergy

## 2022-09-27 DIAGNOSIS — R059 Cough, unspecified: Secondary | ICD-10-CM | POA: Diagnosis not present

## 2022-09-27 DIAGNOSIS — B349 Viral infection, unspecified: Secondary | ICD-10-CM | POA: Diagnosis not present

## 2022-09-27 DIAGNOSIS — J029 Acute pharyngitis, unspecified: Secondary | ICD-10-CM | POA: Diagnosis not present

## 2022-10-08 DIAGNOSIS — J40 Bronchitis, not specified as acute or chronic: Secondary | ICD-10-CM | POA: Diagnosis not present

## 2022-10-08 DIAGNOSIS — J0100 Acute maxillary sinusitis, unspecified: Secondary | ICD-10-CM | POA: Diagnosis not present

## 2022-10-19 DIAGNOSIS — Z0000 Encounter for general adult medical examination without abnormal findings: Secondary | ICD-10-CM | POA: Diagnosis not present

## 2022-10-19 DIAGNOSIS — Z23 Encounter for immunization: Secondary | ICD-10-CM | POA: Diagnosis not present

## 2022-12-23 ENCOUNTER — Emergency Department (HOSPITAL_BASED_OUTPATIENT_CLINIC_OR_DEPARTMENT_OTHER)
Admission: EM | Admit: 2022-12-23 | Discharge: 2022-12-23 | Disposition: A | Discharge status: Home / Self Care | Payer: Federal, State, Local not specified - PPO | Attending: Emergency Medicine | Admitting: Emergency Medicine

## 2022-12-23 ENCOUNTER — Other Ambulatory Visit: Payer: Self-pay

## 2022-12-23 ENCOUNTER — Encounter (HOSPITAL_BASED_OUTPATIENT_CLINIC_OR_DEPARTMENT_OTHER): Payer: Self-pay

## 2022-12-23 ENCOUNTER — Emergency Department (HOSPITAL_BASED_OUTPATIENT_CLINIC_OR_DEPARTMENT_OTHER): Payer: Federal, State, Local not specified - PPO

## 2022-12-23 DIAGNOSIS — W01198A Fall on same level from slipping, tripping and stumbling with subsequent striking against other object, initial encounter: Secondary | ICD-10-CM | POA: Diagnosis not present

## 2022-12-23 DIAGNOSIS — M542 Cervicalgia: Secondary | ICD-10-CM | POA: Insufficient documentation

## 2022-12-23 DIAGNOSIS — S060X0A Concussion without loss of consciousness, initial encounter: Secondary | ICD-10-CM | POA: Insufficient documentation

## 2022-12-23 DIAGNOSIS — S0990XA Unspecified injury of head, initial encounter: Secondary | ICD-10-CM | POA: Diagnosis not present

## 2022-12-23 DIAGNOSIS — R519 Headache, unspecified: Secondary | ICD-10-CM | POA: Diagnosis not present

## 2022-12-23 MED ORDER — DIPHENHYDRAMINE HCL 25 MG PO CAPS
25.0000 mg | ORAL_CAPSULE | Freq: Once | ORAL | Status: AC
  Administered 2022-12-23: 25 mg via ORAL
  Filled 2022-12-23: qty 1

## 2022-12-23 MED ORDER — METOCLOPRAMIDE HCL 5 MG/ML IJ SOLN
10.0000 mg | Freq: Once | INTRAMUSCULAR | Status: AC
  Administered 2022-12-23: 10 mg via INTRAMUSCULAR
  Filled 2022-12-23: qty 2

## 2022-12-23 NOTE — ED Provider Notes (Signed)
[facility] Provider Note   CSN: [id number] Arrival date & time: 12/23/22  1453     History  Chief Complaint  Patient presents with   Head Injury    Tonya Salazar is a 22 y.o. female.  Patient presents emergency department for evaluation of persistent headache and neck pain after head injury sustained 4 days ago.  Patient had a trip and fall with friends initially and struck the left side of her head.  No loss of consciousness or confusion.  She initially felt relatively okay and did not seek medical care.  The following day she had a worsening headache.  She has also had neck pain which she attributes to migraine headaches which she takes amitriptyline for prophylactically.  She was seen by student health 2 days ago and was taken out of school.  Her symptoms are mainly persistent headache with light sensitivity, temporarily improved with Tylenol.  Also hypersomnia and difficulty with concentration.  She has had some disequilibrium with walking at times but has not had any lightheadedness, syncope, or falls.  She presents for further evaluation given persistent symptoms.       Home Medications Prior to Admission medications   Medication Sig Start Date End Date Taking? Authorizing Provider  ADVAIR HFA 115-21 MCG/ACT inhaler Inhale 2 puffs into the lungs 2 (two) times daily. 10/13/21   [provider]  albuterol (PROVENTIL HFA;VENTOLIN HFA) 108 (90 BASE) MCG/ACT inhaler Inhale 1-2 puffs into the lungs every 6 (six) hours as needed for wheezing or shortness of breath.    [provider]  amitriptyline (ELAVIL) 50 MG tablet Take 1 tablet at bedtime. 07/13/22   Ward Givens, NP  beclomethasone (QVAR) 40 MCG/ACT inhaler Inhale 2 puffs into the lungs 2 (two) times daily. Patient not taking: Reported on 05/11/2022    [provider]  co-enzyme Q-10 30 MG capsule Take 30 mg by mouth 3 (three) times daily.    [provider]  Dapsone (ACZONE) 5 % topical gel Apply 1 application topically 2 (two) times daily. 10/20/21   Lavonna Monarch, MD  desogestrel-ethinyl estradiol (APRI) 0.15-30 MG-MCG tablet  03/28/20   [provider]  fluticasone (FLONASE) 50 MCG/ACT nasal spray Place into both nostrils. 10/13/21   [provider]  levocetirizine (XYZAL) 5 MG tablet Take 2.5 mg by mouth every evening.    [provider]  MAGNESIUM PO Take by mouth.    [provider]  minocycline (MINOCIN) 50 MG capsule Take 1 capsule (50 mg total) by mouth 2 (two) times daily. Patient not taking: Reported on 05/11/2022 07/08/21   Robyne Askew R, PA-C  montelukast (SINGULAIR) 5 MG chewable tablet Chew 5 mg by mouth at bedtime.    [provider]  Pyridoxine HCl (VITAMIN B6 PO) Take by mouth.    [provider]  rizatriptan (MAXALT) 10 MG tablet Take 1 tablet at the onset of migraine.May repeat in 2 hours if needed. Only 2 tabs in 24 hours 05/11/22   Ward Givens, NP  Tazarotene (ARAZLO) 0.045 % LOTN Apply 1 application topically at bedtime. 10/20/21   Lavonna Monarch, MD      Allergies    Patient has no known allergies.    Review of Systems   Review of Systems  Physical Exam Updated Vital Signs BP (!) 140/97 (BP Location: Right Arm)   Pulse (!) 128   Temp 99.1 F (37.3 C) (Oral)   Resp 16   Ht 5'  7" (1.702 m)   Wt 83.9 kg   SpO2 97%   BMI 28.98 kg/m   Physical Exam Vitals and nursing note reviewed.  Constitutional:      Appearance: She is well-developed.  HENT:     Head: Normocephalic and atraumatic. No raccoon eyes or Battle's sign.     Right Ear: Tympanic membrane, ear canal and external ear normal. No hemotympanum.     Left Ear: Tympanic membrane, ear canal and external ear normal. No hemotympanum.     Nose: Nose normal.     Mouth/Throat:     Pharynx: Uvula midline.  Eyes:     General: Lids are normal.     Extraocular Movements:     Right eye:  No nystagmus.     Left eye: No nystagmus.     Conjunctiva/sclera: Conjunctivae normal.     Pupils: Pupils are equal, round, and reactive to light.     Comments: No visible hyphema noted  Cardiovascular:     Rate and Rhythm: Normal rate and regular rhythm.  Pulmonary:     Effort: Pulmonary effort is normal.     Breath sounds: Normal breath sounds.  Abdominal:     Palpations: Abdomen is soft.     Tenderness: There is no abdominal tenderness.  Musculoskeletal:     Cervical back: Normal range of motion and neck supple. No tenderness or bony tenderness.     Thoracic back: No tenderness or bony tenderness.     Lumbar back: No tenderness or bony tenderness.  Skin:    General: Skin is warm and dry.  Neurological:     Mental Status: She is alert and oriented to person, place, and time.     GCS: GCS eye subscore is 4. GCS verbal subscore is 5. GCS motor subscore is 6.     Cranial Nerves: No cranial nerve deficit.     Sensory: No sensory deficit.     Coordination: Coordination normal.     Gait: Gait normal.     Comments: Patient is able to stand up from a sitting position and walk across room without any difficulties.     ED Results / Procedures / Treatments   Labs (all labs ordered are listed, but only abnormal results are displayed) Labs Reviewed - No data to display  EKG None  Radiology CT Cervical Spine Wo Contrast  Result Date: 12/23/2022 CLINICAL DATA:  [place] 4 days ago. Trauma to the head and neck. Neck pain. EXAM: CT CERVICAL SPINE WITHOUT CONTRAST TECHNIQUE: Multidetector CT imaging of the cervical spine was performed without intravenous contrast. Multiplanar CT image reconstructions were also generated. RADIATION DOSE REDUCTION: This exam was performed according to the departmental dose-optimization program which includes automated exposure control, adjustment of the mA and/or kV according to patient size and/or use of iterative reconstruction technique. COMPARISON:  None  Available. FINDINGS: Alignment: No traumatic malalignment. Very minimal curvature convex to the right which could be positional. Skull base and vertebrae: No fracture or focal lesion. Soft tissues and spinal canal: No traumatic soft tissue finding. Disc levels: No disc level abnormality. No degenerative change. No stenosis of the canal or foramina. Upper chest: Normal Other: None IMPRESSION: No acute or traumatic finding. Very minimal curvature convex to the right which could be positional. Electronically Signed   By: Nelson Chimes M.D.   On: 12/23/2022 16:58   CT Head Wo Contrast  Result Date: 12/23/2022 CLINICAL DATA:  Headache. Worsening frequency or severity. Neck pain. Fell 4 days  ago. EXAM: CT HEAD WITHOUT CONTRAST TECHNIQUE: Contiguous axial images were obtained from the base of the skull through the vertex without intravenous contrast. RADIATION DOSE REDUCTION: This exam was performed according to the departmental dose-optimization program which includes automated exposure control, adjustment of the mA and/or kV according to patient size and/or use of iterative reconstruction technique. COMPARISON:  None Available. FINDINGS: Brain: The brain shows a normal appearance without evidence of malformation, atrophy, old or acute small or large vessel infarction, mass lesion, hemorrhage, hydrocephalus or extra-axial collection. Vascular: No hyperdense vessel. No evidence of atherosclerotic calcification. Skull: Normal.  No traumatic finding.  No focal bone lesion. Sinuses/Orbits: Mucosal inflammatory changes of the right maxillary sinus. Lesser mucosal thickening of the left division of the sphenoid sinus. Orbits negative. Other: None significant IMPRESSION: 1. Normal head CT. No intracranial abnormality seen explain the clinical presentation. 2. Mucosal inflammatory changes of the right maxillary sinus and left division of the sphenoid sinus. Electronically Signed   By: Nelson Chimes M.D.   On: 12/23/2022 16:56     Procedures Procedures    Medications Ordered in ED Medications  metoCLOPramide (REGLAN) injection 10 mg (10 mg Intramuscular Given 12/23/22 1633)  diphenhydrAMINE (BENADRYL) capsule 25 mg (25 mg Oral Given 12/23/22 1632)    ED Course/ Medical Decision Making/ A&P    Patient seen and examined. History obtained directly from patient.  Parent also bedside.  We discussed risks and benefits, utilities of imaging.  They would prefer to have imaging of the head and neck performed today due to persistent symptoms.  Will also give IM Reglan and oral Benadryl for treatment of headache.  Labs/EKG: None ordered  Imaging: CT head and cervical spine  Medications/Fluids: IM Reglan and Benadryl p.o.  Most recent vital signs reviewed and are as follows: BP (!) 140/97 (BP Location: Right Arm)   Pulse (!) 128   Temp 99.1 F (37.3 C) (Oral)   Resp 16   Ht 5' 7"$  (1.702 m)   Wt 83.9 kg   SpO2 97%   BMI 28.98 kg/m   Initial impression: Concussion symptoms  5:50 PM Reassessment performed. Patient appears stable.  She states that she is starting to feel better after migraine medication.  Imaging personally visualized and interpreted including: CT head and cervical spine, agree negative.  Reviewed pertinent lab work and imaging with patient at bedside. Questions answered.   Most current vital signs reviewed and are as follows: BP 120/80 (BP Location: Right Arm)   Pulse (!) 106   Temp 98.2 F (36.8 C) (Oral)   Resp 17   Ht 5' 7"$  (1.702 m)   Wt 83.9 kg   SpO2 97%   BMI 28.98 kg/m   Plan: Discharge to home.   Prescriptions written for: None  Other home care instructions discussed: Avoidance of activities which make symptoms worse.  ED return instructions discussed: Patient was counseled on head injury precautions and symptoms that should indicate their return to the ED.  These include severe worsening headache, vision changes, confusion, loss of consciousness, trouble walking, nausea  & vomiting, or weakness/tingling in extremities.    Follow-up instructions discussed: Patient encouraged to follow-up with their PCP in 3 days.                                Medical Decision Making Amount and/or Complexity of Data Reviewed Radiology: ordered.  Risk Prescription drug management.   Patient with concussion  symptoms and headache, imaging normal.  In regards to the patient's headache, critical differentials were considered including subarachnoid hemorrhage, intracerebral hemorrhage, epidural/subdural hematoma, pituitary apoplexy, vertebral/carotid artery dissection, giant cell arteritis, central venous thrombosis, reversible cerebral vasoconstriction, acute angle closure glaucoma, idiopathic intracranial hypertension, bacterial meningitis, viral encephalitis, carbon monoxide poisoning, posterior reversible encephalopathy syndrome, pre-eclampsia.   Reg flag symptoms related to these causes were considered including systemic symptoms (fever, weight loss), neurologic symptoms (confusion, mental status change, vision change, associated seizure), acute or sudden "thunderclap" onset, patient age 28 or older with new or progressive headache, patient of any age with first headache or change in headache pattern, pregnant or postpartum status, history of HIV or other immunocompromise, history of cancer, headache occurring with exertion, associated neck or shoulder pain, associated traumatic injury, concurrent use of anticoagulation, family history of spontaneous SAH, and concurrent drug use.    Other benign, more common causes of headache were considered including migraine, tension-type headache, cluster headache, referred pain from other cause such as sinus infection, dental pain, trigeminal neuralgia.   On exam, patient has a reassuring neuro exam including baseline mental status, no significant neck pain or meningeal signs, no signs of severe infection or fever.   The patient's vital  signs, pertinent lab work and imaging were reviewed and interpreted as discussed in the ED course. Hospitalization was considered for further testing, treatments, or serial exams/observation. However as patient is well-appearing, has a stable exam over the course of their evaluation, and reassuring studies today, I do not feel that they warrant admission at this time. This plan was discussed with the patient who verbalizes agreement and comfort with this plan and seems reliable and able to return to the Emergency Department with worsening or changing symptoms.          Final Clinical Impression(s) / ED Diagnoses Final diagnoses:  Concussion without loss of consciousness, initial encounter    Rx / DC Orders ED Discharge Orders     None         Carlisle Cater, PA-C 12/23/22 1754    Charlesetta Shanks, MD 12/24/22 1547

## 2022-12-23 NOTE — Discharge Instructions (Signed)
Head CT and cervical spine CT were negative for fracture or other injuries today.  Everything appears healthy.

## 2022-12-23 NOTE — ED Triage Notes (Signed)
Patient here POV from Home.  Endorses Saturday tripping falling onto Carpeted Floor. Seen at [facility] on Monday, Diagnosed with Concussion and seeks Evaluation today for continued Symptoms including Fatigue, Neck Pain, and Headache.   NAD noted during Triage. A&Ox4. GCS 15. Ambulatory.

## 2022-12-23 NOTE — ED Notes (Signed)
Pt in bed, dad at bedside, pt states that she is feeling better, pt verbalized understanding d/c and follow up, pt ambulatory from dpt with dad

## 2022-12-30 ENCOUNTER — Ambulatory Visit: Payer: Federal, State, Local not specified - PPO | Admitting: Adult Health

## 2023-01-07 ENCOUNTER — Encounter: Payer: Self-pay | Admitting: Adult Health

## 2023-01-08 ENCOUNTER — Other Ambulatory Visit: Payer: Self-pay | Admitting: Adult Health

## 2023-01-08 ENCOUNTER — Other Ambulatory Visit: Payer: Self-pay | Admitting: Neurology

## 2023-01-08 DIAGNOSIS — G43019 Migraine without aura, intractable, without status migrainosus: Secondary | ICD-10-CM

## 2023-01-08 MED ORDER — AMITRIPTYLINE HCL 50 MG PO TABS: ORAL_TABLET | ORAL | 1 refills | Status: DC

## 2023-03-19 DIAGNOSIS — J309 Allergic rhinitis, unspecified: Secondary | ICD-10-CM | POA: Diagnosis not present

## 2023-03-19 DIAGNOSIS — J4540 Moderate persistent asthma, uncomplicated: Secondary | ICD-10-CM | POA: Diagnosis not present

## 2023-03-19 DIAGNOSIS — H1045 Other chronic allergic conjunctivitis: Secondary | ICD-10-CM | POA: Diagnosis not present

## 2023-03-19 DIAGNOSIS — R21 Rash and other nonspecific skin eruption: Secondary | ICD-10-CM | POA: Diagnosis not present

## 2023-04-05 ENCOUNTER — Encounter: Payer: Self-pay | Admitting: Adult Health

## 2023-04-05 DIAGNOSIS — G43019 Migraine without aura, intractable, without status migrainosus: Secondary | ICD-10-CM

## 2023-04-05 MED ORDER — AMITRIPTYLINE HCL 50 MG PO TABS: ORAL_TABLET | ORAL | 1 refills | Status: DC

## 2023-05-12 ENCOUNTER — Encounter: Payer: Self-pay | Admitting: Adult Health

## 2023-05-12 ENCOUNTER — Ambulatory Visit: Payer: Federal, State, Local not specified - PPO | Admitting: Adult Health

## 2023-05-12 VITALS — BP 133/84 | HR 122 | Ht 68.0 in | Wt 189.0 lb

## 2023-05-12 DIAGNOSIS — G43709 Chronic migraine without aura, not intractable, without status migrainosus: Secondary | ICD-10-CM | POA: Diagnosis not present

## 2023-05-12 NOTE — Progress Notes (Signed)
PATIENT: Tonya Salazar DOB: [birth year]  REASON FOR VISIT: follow up HISTORY FROM: patient PRIMARY NEUROLOGIST: Dr. Frances Furbish  Chief Complaint  Patient presents with   Migraine    Rm 20 alone Pt is well and stable, reports migraines are about the same as last visit. She gets about 3 a month. She also reports a recent concussion in Feb.     HISTORY OF PRESENT ILLNESS:  Today 05/12/23:  Tonya Salazar is a 22 y.o. female with a history of Migraines. Returns today for follow-up. Continues on Amitriptyline 50 mg at bedtime.  Reports that her headaches have been under relatively good control.  Has about 3 headaches a month.  Typically can take over-the-counter Tylenol and this works well.  She has never tried Imitrex or Maxalt.  Nurtec was prescribed but she never tried this either.  HISTORY  05/11/22: Tonya Salazar is a 22 year old female with a history of migraines. She returns today for follow-up. Continues on Amitriptyline 50 mg at bedtime. She reports that she has approximately 1-2 headaches a month.  She typically uses Tylenol but it does not always resolve the headache.  Has never tried Imitrex or Maxalt.  Nurtec was ordered for her in the past but was not approved through her insurance because she has not tried a triptan medication.  She returns today for an evaluation.  7/5/2022Ms. Salazar is an 22 year old female with a history of migraine headaches.  She returns today for follow-up.  At the last visit Dr. Frances Furbish started the patient on amitriptyline 50 mg at bedtime.  She reports that this is working well.  She has approximately 1 headache a week.  She typically can take ibuprofen and the headache resolves within 15 minutes.  She was unable to get Nurtec as her insurance did not cover it.  She returns today for an evaluation.   (copied from Dr. Teofilo Pod note) Tonya Salazar is an 22 year old female with an underlying medical history of allergic rhinitis, dysmenorrhea, perioral dermatitis, who  reports an approximately 35-month history of recurrent headaches which are primarily located in the back of her head, left or right side.  She has associated nausea, typically no vomiting and some light sensitivity, some sound sensitivity.  Headache is throbbing and ranges in severity, is currently almost daily.  She has been taking over-the-counter ibuprofen and Excedrin, sometimes 3 at a time and almost daily.  She had side effects from both Zonegran and baclofen, baclofen made her very drowsy even half a pill of the 10 mg strength which she took in the evening, she felt drowsy the next day and missed school.  She took Zonegran which was 25 mg strength and she was advised to titrate this up to 4 pills at night in weekly increments, she only took it for a week at the 25 mg strength and felt that her headaches became worse and that she was groggy from it.  She has since then discontinued these medications which were prescribed by Dr[facility] Neale Burly [facility].  She has no follow-up pending at this time or canceled it.  She has made an appointment for an eye examination which is pending for June.  At Dr. Onnie Boer office her visual acuity was 20/20.   She has no family history of migraines.  She has no history of head injuries.  She does report school related stress, tries to sleep at least 7 hours, typically in bed by 11 and rise time is around 7.  On the weekends she has to get up at 6 to go to work.  She works as a Training and development officer.  She does not have any strenuous work.  In fifth grade she had an injury to her right knee as she was bicycling but did not have a head injury.  She tries to hydrate well, estimates that she drinks at least 60 ounces of water, limits her caffeine to 1 cup in the morning of coffee.  She does not drink alcohol or smoke.  She lives with her family which includes parents and 3 sisters, none with migraines.   I reviewed your office records.  She was seen in a televisit on  12/31/2020 and I reviewed the note.  She reported a 33-week history of worsening headaches with nearly daily occurrence.  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  She was started on magnesium, riboflavin and coenzyme Q 10, encouraged to hydrate well and not miss any food or snacks.  She was referred to the headache wellness center and was seen and March.  She was started on zonisamide and baclofen as needed.  I was able to review the office visit note from 01/21/1999 2010 when she saw Dr. Neale Burly.  She was felt to have chronic migraines without aura, intractable, facial neuralgia, chronic facial pain, cervicalgia, spasm of muscle, medication overuse headache, myalgia.  Blood work was ordered including ESR, lipid panel, liver profile and BMP.  I do not have those blood test results available for my review.  REVIEW OF SYSTEMS: Out of a complete 14 system review of symptoms, the patient complains only of the following symptoms, and all other reviewed systems are negative.  See HPI  ALLERGIES: No Known Allergies  HOME MEDICATIONS: Outpatient Medications Prior to Visit  Medication Sig Dispense Refill   ADVAIR HFA 115-21 MCG/ACT inhaler Inhale 2 puffs into the lungs 2 (two) times daily.     albuterol (PROVENTIL HFA;VENTOLIN HFA) 108 (90 BASE) MCG/ACT inhaler Inhale 1-2 puffs into the lungs every 6 (six) hours as needed for wheezing or shortness of breath.     amitriptyline (ELAVIL) 50 MG tablet Take 1 tablet at bedtime. 90 tablet 1   co-enzyme Q-10 30 MG capsule Take 30 mg by mouth 3 (three) times daily.     desogestrel-ethinyl estradiol (APRI) 0.15-30 MG-MCG tablet      fluticasone (FLONASE) 50 MCG/ACT nasal spray Place into both nostrils.     levocetirizine (XYZAL) 5 MG tablet Take 2.5 mg by mouth every evening.     MAGNESIUM PO Take by mouth.     montelukast (SINGULAIR) 5 MG chewable tablet Chew 5 mg by mouth at bedtime.     Pyridoxine HCl (VITAMIN B6 PO) Take by mouth.      rizatriptan (MAXALT) 10 MG tablet Take 1 tablet at the onset of migraine.May repeat in 2 hours if needed. Only 2 tabs in 24 hours 10 tablet 5   beclomethasone (QVAR) 40 MCG/ACT inhaler Inhale 2 puffs into the lungs 2 (two) times daily. (Patient not taking: Reported on 05/11/2022)     Dapsone (ACZONE) 5 % topical gel Apply 1 application topically 2 (two) times daily. (Patient not taking: Reported on 05/12/2023) 30 g 3   minocycline (MINOCIN) 50 MG capsule Take 1 capsule (50 mg total) by mouth 2 (two) times daily. (Patient not taking: Reported on 05/11/2022) 60 capsule 11   Tazarotene (ARAZLO) 0.045 % LOTN Apply 1 application topically at bedtime. (Patient  not taking: Reported on 05/12/2023) 60 g 3   No facility-administered medications prior to visit.    PAST MEDICAL HISTORY: Past Medical History:  Diagnosis Date   Asthma     PAST SURGICAL HISTORY: History reviewed. No pertinent surgical history.  FAMILY HISTORY: Family History  Problem Relation Age of Onset   Diabetes Mother    Migraines Neg Hx     SOCIAL HISTORY: Social History   Socioeconomic History   Marital status: Single    Spouse name: Not on file   Number of children: Not on file   Years of education: Not on file   Highest education level: Not on file  Occupational History   Not on file  Tobacco Use   Smoking status: Never   Smokeless tobacco: Never  Vaping Use   Vaping Use: Never used  Substance and Sexual Activity   Alcohol use: Not Currently    Comment: sometimes   Drug use: Never   Sexual activity: Not on file  Other Topics Concern   Not on file  Social History Narrative   Going into sophomore year at [employer] fall 2023   Right handed   Caffeine: 1 cup coffee/day   Social Determinants of Health   Financial Resource Strain: Not on file  Food Insecurity: Not on file  Transportation Needs: Not on file  Physical Activity: Not on file  Stress: Not on file  Social Connections: Not on file  Intimate Partner  Violence: Not on file      PHYSICAL EXAM  Vitals:   05/12/23 0932  BP: 133/84  Pulse: (!) 122  Weight: 189 lb (85.7 kg)  Height: 5\' 8"  (1.727 m)   Body mass index is 28.74 kg/m.  Generalized: Well developed, in no acute distress   Neurological examination  Mentation: Alert oriented to time, place, history taking. Follows all commands speech and language fluent Cranial nerve II-XII: Pupils were equal round reactive to light. Extraocular movements were full, visual field were full on confrontational test. Facial sensation and strength were normal.  Head turning and shoulder shrug  were normal and symmetric. Motor: The motor testing reveals 5 over 5 strength of all 4 extremities. Good symmetric motor tone is noted throughout.  Sensory: Sensory testing is intact to soft touch on all 4 extremities. No evidence of extinction is noted.  Coordination: Cerebellar testing reveals good finger-nose-finger and heel-to-shin bilaterally.  Gait and station: Gait is normal. Tandem gait is normal. Romberg is negative. No drift is seen.  Reflexes: Deep tendon reflexes are symmetric and normal bilaterally.   DIAGNOSTIC DATA (LABS, IMAGING, TESTING) - I reviewed patient records, labs, notes, testing and imaging myself where available.     ASSESSMENT AND PLAN 22 y.o. year old female  has a past medical history of Asthma. here with:  1.  Migraine headaches  --Prevention: Amitriptyline 50 mg at bedtime --Abortive: Try Maxalt 10 mg at the onset of a migraine repeat in 2 hours if needed--reviewed medication side effects with the patient and provided her with information on her MyChart account --Advised that if her headache frequency increases she should let us know. --Follow-up in 1 year or sooner if needed     Butch Penny, MSN, NP-C 05/12/2023, 10:08 AM [facility] [address] [place], [place] [address] [phone]

## 2023-05-12 NOTE — Patient Instructions (Signed)
Your Plan:  Continue Amitriptyline 50 mg at bedtime If your symptoms worsen or you develop new symptoms please let us know.     Thank you for coming to see [place] at [facility]. I hope we have been able to provide you high quality care today.  You may receive a patient satisfaction survey over the next few weeks. We would appreciate your feedback and comments so that we may continue to improve ourselves and the health of our patients.

## 2023-05-18 DIAGNOSIS — R052 Subacute cough: Secondary | ICD-10-CM | POA: Diagnosis not present

## 2023-05-27 DIAGNOSIS — L739 Follicular disorder, unspecified: Secondary | ICD-10-CM | POA: Diagnosis not present

## 2023-06-14 DIAGNOSIS — Z01419 Encounter for gynecological examination (general) (routine) without abnormal findings: Secondary | ICD-10-CM | POA: Diagnosis not present

## 2023-06-14 DIAGNOSIS — Z113 Encounter for screening for infections with a predominantly sexual mode of transmission: Secondary | ICD-10-CM | POA: Diagnosis not present

## 2023-06-14 DIAGNOSIS — Z130 Encounter for screening for diseases of the blood and blood-forming organs and certain disorders involving the immune mechanism: Secondary | ICD-10-CM | POA: Diagnosis not present

## 2023-06-25 DIAGNOSIS — J09X2 Influenza due to identified novel influenza A virus with other respiratory manifestations: Secondary | ICD-10-CM | POA: Diagnosis not present

## 2023-06-25 DIAGNOSIS — Z20828 Contact with and (suspected) exposure to other viral communicable diseases: Secondary | ICD-10-CM | POA: Diagnosis not present

## 2023-07-15 ENCOUNTER — Other Ambulatory Visit: Payer: Self-pay | Admitting: Family Medicine

## 2023-07-15 ENCOUNTER — Ambulatory Visit
Admission: RE | Admit: 2023-07-15 | Discharge: 2023-07-15 | Disposition: A | Discharge status: Home / Self Care | Payer: Federal, State, Local not specified - PPO | Source: Ambulatory Visit | Attending: Family Medicine | Admitting: Family Medicine

## 2023-07-15 DIAGNOSIS — Z3202 Encounter for pregnancy test, result negative: Secondary | ICD-10-CM | POA: Diagnosis not present

## 2023-07-15 DIAGNOSIS — R599 Enlarged lymph nodes, unspecified: Secondary | ICD-10-CM | POA: Diagnosis not present

## 2023-07-15 DIAGNOSIS — R0602 Shortness of breath: Secondary | ICD-10-CM | POA: Diagnosis not present

## 2023-07-15 DIAGNOSIS — R059 Cough, unspecified: Secondary | ICD-10-CM | POA: Diagnosis not present

## 2023-07-28 DIAGNOSIS — R590 Localized enlarged lymph nodes: Secondary | ICD-10-CM | POA: Diagnosis not present

## 2023-07-29 DIAGNOSIS — R590 Localized enlarged lymph nodes: Secondary | ICD-10-CM | POA: Diagnosis not present

## 2023-07-30 DIAGNOSIS — R002 Palpitations: Secondary | ICD-10-CM | POA: Diagnosis not present

## 2023-07-30 DIAGNOSIS — Z23 Encounter for immunization: Secondary | ICD-10-CM | POA: Diagnosis not present

## 2023-07-30 DIAGNOSIS — R059 Cough, unspecified: Secondary | ICD-10-CM | POA: Diagnosis not present

## 2023-07-30 DIAGNOSIS — R918 Other nonspecific abnormal finding of lung field: Secondary | ICD-10-CM | POA: Diagnosis not present

## 2023-07-30 DIAGNOSIS — R000 Tachycardia, unspecified: Secondary | ICD-10-CM | POA: Diagnosis not present

## 2023-07-30 DIAGNOSIS — R519 Headache, unspecified: Secondary | ICD-10-CM | POA: Diagnosis not present

## 2023-07-30 DIAGNOSIS — J984 Other disorders of lung: Secondary | ICD-10-CM | POA: Diagnosis not present

## 2023-07-30 DIAGNOSIS — R7989 Other specified abnormal findings of blood chemistry: Secondary | ICD-10-CM | POA: Diagnosis not present

## 2023-07-31 DIAGNOSIS — R000 Tachycardia, unspecified: Secondary | ICD-10-CM | POA: Diagnosis not present

## 2023-08-10 ENCOUNTER — Ambulatory Visit: Payer: Federal, State, Local not specified - PPO | Attending: Cardiology | Admitting: Cardiology

## 2023-08-10 ENCOUNTER — Encounter: Payer: Self-pay | Admitting: Cardiology

## 2023-08-10 VITALS — BP 128/84 | HR 103 | Ht 67.0 in | Wt 189.0 lb

## 2023-08-10 DIAGNOSIS — R000 Tachycardia, unspecified: Secondary | ICD-10-CM

## 2023-08-10 DIAGNOSIS — R0602 Shortness of breath: Secondary | ICD-10-CM | POA: Diagnosis not present

## 2023-08-10 NOTE — Progress Notes (Signed)
Cardiology Office Note:  .   Date:  08/10/2023  ID:  Tonya Salazar, DOB [birth year], MRN [id number] PCP: Shon Hale, MD  [facility] Providers Cardiologist:  None    History of Present Illness: Marland Kitchen   Tonya Salazar is a 22 y.o. female Discussed the use of AI scribe software for clinical note transcription with the patient, who gave verbal consent to proceed.  History of Present Illness   The patient, with a history of persistent cough since April, presents with concerns of elevated blood pressure and pulse.  She reports experiencing dizziness upon standing and during ambulation, and has noticed a rapid heartbeat. The patient describes difficulty with activities such as climbing stairs, which were previously manageable. She has been monitoring her blood pressure at home, noting consistently high readings.  The patient's primary care provider initiated treatment with Propranolol, which has helped to lower her blood pressure, but her pulse remains high. She has undergone various tests, including a D-dimer, thyroid test, and a test for fluid in the heart. The D-dimer returned elevated, prompting a CT scan and an ultrasound of the heart at the [facility] ER. Both tests were reported as normal, with the exception of tachycardia.  The patient has a family history of high blood pressure, with both parents affected. She has been active in the past, participating in sports during high school. She has not experienced any fainting episodes, but reports feeling unwell at times. The patient is currently a Holiday representative in college Stage manager), Production assistant, radio.            Studies Reviewed: Marland Kitchen   EKG Interpretation Date/Time:  Tuesday August 10 2023 14:38:32 EDT Ventricular Rate:  103 PR Interval:  126 QRS Duration:  70 QT Interval:  336 QTC Calculation: 440 R Axis:   39  Text Interpretation: Sinus tachycardia No previous ECGs available Confirmed by Donato Schultz ([id number]) on 08/10/2023  2:54:38 PM     Risk Assessment/Calculations:            Physical Exam:   VS:  BP 128/84   Pulse (!) 103   Ht 5\' 7"  (1.702 m)   Wt 189 lb (85.7 kg)   SpO2 98%   BMI 29.60 kg/m    Wt Readings from Last 3 Encounters:  08/10/23 189 lb (85.7 kg)  05/12/23 189 lb (85.7 kg)  12/23/22 185 lb (83.9 kg)    GEN: Well nourished, well developed in no acute distress NECK: No JVD; No carotid bruits CARDIAC: RRR, no murmurs, no rubs, no gallops RESPIRATORY:  Clear to auscultation without rales, wheezing or rhonchi  ABDOMEN: Soft, non-tender, non-distended EXTREMITIES:  No edema; No deformity   ASSESSMENT AND PLAN: .    Assessment and Plan    Tachycardia and Hypertension Elevated heart rate and blood pressure noted on multiple readings. Symptoms of dizziness and palpitations, particularly with exertion. Possible Postural Orthostatic Tachycardia Syndrome (POTS) or dysautonomia. Normal EKG and echocardiogram reported from ER visit. Currently on Propranolol with some improvement in blood pressure but persistent tachycardia. -Order formal echocardiogram for further evaluation. (In ER was spot check, bedside) -Continue Propranolol. -Advise increased hydration and salt intake. -Encourage daily exercise.  Chronic Cough Ongoing since April with associated phlegm. No clear diagnosis yet. Appointment scheduled in November for further evaluation. -Bronchitis on CT  Elevated D-dimer Noted on previous testing, but CT scan was negative for pulmonary embolism. Likely related to chronic cough/inflammation.             Dispo:  Will follow up with ECHO  Signed, Donato Schultz, MD

## 2023-08-10 NOTE — Patient Instructions (Signed)
Medication Instructions:  The current medical regimen is effective;  continue present plan and medications.  *If you need a refill on your cardiac medications before your next appointment, please call your pharmacy*   Testing/Procedures: Your physician has requested that you have an echocardiogram. Echocardiography is a painless test that uses sound waves to create images of your heart. It provides your doctor with information about the size and shape of your heart and how well your heart's chambers and valves are working. This procedure takes approximately one hour. There are no restrictions for this procedure. Please do NOT wear cologne, perfume, aftershave, or lotions (deodorant is allowed). Please arrive 15 minutes prior to your appointment time.   Follow-Up: At [facility], you and your health needs are our priority.  As part of our continuing mission to provide you with exceptional heart care, we have created designated Provider Care Teams.  These Care Teams include your primary Cardiologist (physician) and Advanced Practice Providers (APPs -  Physician Assistants and Nurse Practitioners) who all work together to provide you with the care you need, when you need it.  We recommend signing up for the patient portal called "MyChart".  Sign up information is provided on this After Visit Summary.  MyChart is used to connect with patients for Virtual Visits (Telemedicine).  Patients are able to view lab/test results, encounter notes, upcoming appointments, etc.  Non-urgent messages can be sent to your provider as well.   To learn more about what you can do with MyChart, go to [email or web address].    Your next appointment:   Follow up will be determine after the above testing.

## 2023-08-31 ENCOUNTER — Ambulatory Visit (HOSPITAL_COMMUNITY): Payer: Federal, State, Local not specified - PPO | Attending: Cardiology

## 2023-08-31 DIAGNOSIS — R0602 Shortness of breath: Secondary | ICD-10-CM | POA: Diagnosis not present

## 2023-08-31 LAB — ECHOCARDIOGRAM COMPLETE
Area-P 1/2: 3.72 cm2
S' Lateral: 3.5 cm

## 2023-09-23 ENCOUNTER — Encounter: Payer: Self-pay | Admitting: Pulmonary Disease

## 2023-09-23 ENCOUNTER — Ambulatory Visit: Payer: Federal, State, Local not specified - PPO | Admitting: Pulmonary Disease

## 2023-09-23 VITALS — BP 140/87 | HR 99 | Temp 98.8°F | Ht 67.0 in | Wt 191.4 lb

## 2023-09-23 DIAGNOSIS — R918 Other nonspecific abnormal finding of lung field: Secondary | ICD-10-CM | POA: Diagnosis not present

## 2023-09-23 DIAGNOSIS — R053 Chronic cough: Secondary | ICD-10-CM

## 2023-09-23 MED ORDER — PREDNISONE 20 MG PO TABS: 20.0000 mg | ORAL_TABLET | Freq: Every day | ORAL | 0 refills | Status: AC

## 2023-09-23 MED ORDER — LEVOFLOXACIN 500 MG PO TABS: 500.0000 mg | ORAL_TABLET | Freq: Every day | ORAL | 0 refills | Status: AC

## 2023-09-23 NOTE — Patient Instructions (Addendum)
Prescription for Levaquin called to pharmacy-antibiotic  Prescription for prednisone  You are already on the highest dose of Advair 230, 2 puffs twice a day  Make sure you follow-up with the CT scan to ensure that the haziness that was found in the lung clears up  Tentative follow-up in about 6 weeks if you are still having issues, that appointment can be canceled if you are back to your usual  Call us with significant concerns

## 2023-09-23 NOTE — Progress Notes (Signed)
Tonya Salazar    [id number]    [birth year]  Primary Care Physician:Timberlake, Meridee Score, MD  Referring Physician: Shon Hale, MD [address] [place],  [place] [address]  Chief complaint:   Patient being seen for a chronic cough  HPI:  Has had a cough on and off since about April Has used multiple courses of antibiotics including amoxicillin doxycycline Z-Pak Cough will appear to be going away and then research  She is also being treated for POTS  Has a history of asthma for which she is on Advair HFA and Singulair  She feels asthma symptoms are fairly well-controlled Rarely uses albuterol  Activity level is stable  Has not been able to completely get rid of the cough  Did have a recent CT scan of the chest which showed a nodular infiltrative process for which a follow-up was recommended in 3 months CT scan was done in September, report was reviewed in care everywhere  Respiratory viral panel in August also did not reveal any organism  She does not smoke  Archivist Outpatient Encounter Medications as of 09/23/2023  Medication Sig   albuterol (PROVENTIL HFA;VENTOLIN HFA) 108 (90 BASE) MCG/ACT inhaler Inhale 1-2 puffs into the lungs every 6 (six) hours as needed for wheezing or shortness of breath.   amitriptyline (ELAVIL) 50 MG tablet Take 1 tablet at bedtime.   co-enzyme Q-10 30 MG capsule Take 30 mg by mouth 3 (three) times daily.   desogestrel-ethinyl estradiol (APRI) 0.15-30 MG-MCG tablet    fluticasone (FLONASE) 50 MCG/ACT nasal spray Place into both nostrils.   fluticasone-salmeterol (ADVAIR HFA) 230-21 MCG/ACT inhaler Inhale 2 puffs into the lungs 2 (two) times daily.   levocetirizine (XYZAL) 5 MG tablet Take 2.5 mg by mouth every evening.   MAGNESIUM PO Take by mouth.   montelukast (SINGULAIR) 5 MG chewable tablet Chew 5 mg by mouth at bedtime.   omeprazole (PRILOSEC) 20 MG capsule Take 20 mg by mouth daily.   propranolol  (INDERAL) 10 MG tablet Take 10 mg by mouth in the morning and at bedtime.   Pyridoxine HCl (VITAMIN B6 PO) Take by mouth.   No facility-administered encounter medications on file as of 09/23/2023.    Allergies as of 09/23/2023   (No Known Allergies)    Past Medical History:  Diagnosis Date   Allergic rhinitis    Asthma    Cough variant asthma    Dysfunctional uterine bleeding    Dysmenorrhea    Dysmenorrhea in adolescent    Migraine headache    Personal history of COVID-19    Tension headache     Past Surgical History:  Procedure Laterality Date   TEAR DUCT DILIATION     TYMPANOSTOMY TUBE PLACEMENT Bilateral     Family History  Problem Relation Age of Onset   Diabetes Mother    High blood pressure Mother    High blood pressure Father    Migraines Neg Hx     Social History   Socioeconomic History   Marital status: Single    Spouse name: Not on file   Number of children: Not on file   Years of education: Not on file   Highest education level: Not on file  Occupational History   Not on file  Tobacco Use   Smoking status: Never   Smokeless tobacco: Never  Vaping Use   Vaping status: Never Used  Substance and Sexual Activity   Alcohol use:  Yes    Alcohol/week: 3.0 standard drinks of alcohol    Types: 3 Glasses of wine per week    Comment: sometimes   Drug use: Never   Sexual activity: Not on file  Other Topics Concern   Not on file  Social History Narrative   Going into sophomore year at [employer] fall 2023   Right handed   Caffeine: 1 cup coffee/day   Social Determinants of Health   Financial Resource Strain: Not on file  Food Insecurity: Not on file  Transportation Needs: Not on file  Physical Activity: Not on file  Stress: Not on file  Social Connections: Not on file  Intimate Partner Violence: Not on file    Review of Systems  Respiratory:  Positive for cough.     There were no vitals filed for this visit.   Physical Exam Constitutional:       Appearance: Normal appearance.  HENT:     Head: Normocephalic.     Mouth/Throat:     Mouth: Mucous membranes are moist.  Eyes:     General: No scleral icterus. Cardiovascular:     Rate and Rhythm: Normal rate and regular rhythm.     Heart sounds: No murmur heard.    No friction rub.  Pulmonary:     Effort: No respiratory distress.     Breath sounds: No stridor. No wheezing or rhonchi.  Musculoskeletal:     Cervical back: No rigidity or tenderness.  Neurological:     Mental Status: She is alert.  Psychiatric:        Mood and Affect: Mood normal.    Data Reviewed: CT report 07/30/2023-reviewed on Care Everywhere, 1.8 cm mixed groundglass opacity with consolidation with air bronchograms noted in the right upper lobe  Assessment:  Chronic cough  Abnormal CT scan of the chest  History of asthma -Asthma may be getting out of control with a respiratory infection  Plan/Recommendations:  Will send in a prescription for Levaquin 500 for 7 days  Prednisone 20 daily for up to 7 days  Continue Advair use Continue Singulair  Ensure to follow-up with CT scan of the chest  Tentative follow-up in about 6 weeks  Encouraged to call with significant concerns     Virl Diamond MD [facility] 09/23/2023, 3:18 PM  CC: Shon Hale, *

## 2023-09-28 ENCOUNTER — Encounter: Payer: Self-pay | Admitting: Pulmonary Disease

## 2023-09-28 DIAGNOSIS — R053 Chronic cough: Secondary | ICD-10-CM

## 2023-09-29 NOTE — Telephone Encounter (Signed)
Dr Val Eagle- please advise    Hello, I recently saw your office to follow up on a reoccurring cough. At the end of September, I went to the [facility] ER and got a CT scan that showed a haziness in my lung.  You recommended I follow up with another CT scan to compare against my old one, through [facility]. I tried to make an appointment with them but they require that I have a doctor order a CT scan. Would it be possible for you to order a CT scan for me at the [facility]? Thank you.

## 2023-09-29 NOTE — Telephone Encounter (Signed)
Okay to place order for CT for lung infiltrate.  Follow-up on previous CT

## 2023-10-06 ENCOUNTER — Encounter: Payer: Self-pay | Admitting: Adult Health

## 2023-10-06 DIAGNOSIS — G43019 Migraine without aura, intractable, without status migrainosus: Secondary | ICD-10-CM

## 2023-10-07 MED ORDER — AMITRIPTYLINE HCL 50 MG PO TABS: ORAL_TABLET | ORAL | 1 refills | Status: DC

## 2023-10-23 DIAGNOSIS — R053 Chronic cough: Secondary | ICD-10-CM | POA: Diagnosis not present

## 2023-10-23 DIAGNOSIS — J984 Other disorders of lung: Secondary | ICD-10-CM | POA: Diagnosis not present

## 2023-11-02 DIAGNOSIS — R000 Tachycardia, unspecified: Secondary | ICD-10-CM | POA: Diagnosis not present

## 2023-11-02 DIAGNOSIS — F419 Anxiety disorder, unspecified: Secondary | ICD-10-CM | POA: Diagnosis not present

## 2023-11-02 DIAGNOSIS — G43109 Migraine with aura, not intractable, without status migrainosus: Secondary | ICD-10-CM | POA: Diagnosis not present

## 2023-11-02 DIAGNOSIS — J45991 Cough variant asthma: Secondary | ICD-10-CM | POA: Diagnosis not present

## 2023-11-02 DIAGNOSIS — Z0000 Encounter for general adult medical examination without abnormal findings: Secondary | ICD-10-CM | POA: Diagnosis not present

## 2023-11-19 DIAGNOSIS — H1045 Other chronic allergic conjunctivitis: Secondary | ICD-10-CM | POA: Diagnosis not present

## 2023-11-19 DIAGNOSIS — J309 Allergic rhinitis, unspecified: Secondary | ICD-10-CM | POA: Diagnosis not present

## 2023-11-19 DIAGNOSIS — R21 Rash and other nonspecific skin eruption: Secondary | ICD-10-CM | POA: Diagnosis not present

## 2023-11-19 DIAGNOSIS — J4540 Moderate persistent asthma, uncomplicated: Secondary | ICD-10-CM | POA: Diagnosis not present

## 2023-12-01 ENCOUNTER — Ambulatory Visit: Payer: Federal, State, Local not specified - PPO | Admitting: Pulmonary Disease

## 2023-12-25 DIAGNOSIS — J069 Acute upper respiratory infection, unspecified: Secondary | ICD-10-CM | POA: Diagnosis not present

## 2023-12-29 DIAGNOSIS — R41840 Attention and concentration deficit: Secondary | ICD-10-CM | POA: Diagnosis not present

## 2023-12-30 DIAGNOSIS — R41840 Attention and concentration deficit: Secondary | ICD-10-CM | POA: Diagnosis not present

## 2024-01-07 DIAGNOSIS — Z79899 Other long term (current) drug therapy: Secondary | ICD-10-CM | POA: Diagnosis not present

## 2024-01-07 DIAGNOSIS — F902 Attention-deficit hyperactivity disorder, combined type: Secondary | ICD-10-CM | POA: Diagnosis not present

## 2024-01-07 DIAGNOSIS — F419 Anxiety disorder, unspecified: Secondary | ICD-10-CM | POA: Diagnosis not present

## 2024-01-07 DIAGNOSIS — R41840 Attention and concentration deficit: Secondary | ICD-10-CM | POA: Diagnosis not present

## 2024-02-02 DIAGNOSIS — F411 Generalized anxiety disorder: Secondary | ICD-10-CM | POA: Diagnosis not present

## 2024-04-05 ENCOUNTER — Other Ambulatory Visit: Payer: Self-pay | Admitting: Adult Health

## 2024-04-05 DIAGNOSIS — G43019 Migraine without aura, intractable, without status migrainosus: Secondary | ICD-10-CM

## 2024-05-16 ENCOUNTER — Telehealth: Payer: Federal, State, Local not specified - PPO | Admitting: Adult Health

## 2024-05-16 DIAGNOSIS — G43709 Chronic migraine without aura, not intractable, without status migrainosus: Secondary | ICD-10-CM

## 2024-05-16 NOTE — Patient Instructions (Signed)
 Your Plan:  Alternate taking half a tablet of amitriptyline  with 1 full tablet of amitriptyline  every other night for 2 weeks. Example: Night 1: 1/2 tablet, night 2: 1 tablet, night 3: 1/2 tablet and so on for 2 weeks. Then reduce to half a tablet every night thereafter.  If your headache frequency remains stable.  We will consider further weaning this medication.  When you are having a migraine please pay attention to if you have visual changes.  If you are having dots in your vision before or during your migraine please let us  know.  This would be a migraine with aura and as discussed today you would need to consider switching your birth control to one that does not have estrogen.  As estrogen with a history of migraine with aura can increase your stroke risk.     Thank you for coming to see us  at [facility]. I hope we have been able to provide you high quality care today.  You may receive a patient satisfaction survey over the next few weeks. We would appreciate your feedback and comments so that we may continue to improve ourselves and the health of our patients.

## 2024-05-16 NOTE — Progress Notes (Signed)
 PATIENT: Tonya Salazar DOB: April 06, 2001  REASON FOR VISIT: follow up HISTORY FROM: patient  Virtual Visit via Video Note  I connected with Tonya Salazar on 05/16/24 at  1:00 PM EDT by a video enabled telemedicine application located remotely at [facility] and verified that I am speaking with the correct person using two identifiers who was located at their own home in [place].   I discussed the limitations of evaluation and management by telemedicine and the availability of in person appointments. The patient expressed understanding and agreed to proceed.   PATIENT: Tonya Salazar DOB: 11-13-2000  REASON FOR VISIT: follow up HISTORY FROM: patient  HISTORY OF PRESENT ILLNESS: Today 05/16/24  Tonya Salazar is a 23 y.o. female with a history of Migraine headaches. Returns today for follow-up.  She is overall her headaches have been under good control.  She has remained on amitriptyline .  She states more recently her PCP has placed her on Prozac and Wellbutrin.  She was also placed on propranolol by cardiology due to potential diagnosis of POTS.  At the last visit she was prescribed Maxalt  to treat her acute headaches but she states that she never started this.  Returns today for an evaluation.  Location: behind the eyes Frequency: 2/ month Duration: 30 minutes without medications Aura: no Photophonia: no Phonophobia: yes Nausea: no Vomiting: no Numbness: no Weakness: no Visual changes: She states with some headaches she may briefly see dots but they do not stay in her vision it comes and goes pretty quickly. Gait and balance: no    Imaging: CT head December 23, 2022-reviewed in epic  Cardiac history: recently HTN and tachycardia- placed on propranolol- states may be POTS    HISTORY 05/12/23:   Tonya Salazar is a 23 y.o. female with a history of Migraines. Returns today for follow-up. Continues on Amitriptyline  50 mg at bedtime.  Reports that her headaches  have been under relatively good control.  Has about 3 headaches a month.  Typically can take over-the-counter Tylenol and this works well.  She has never tried Imitrex or Maxalt .  Nurtec was prescribed but she never tried this either.   HISTORY   05/11/22: Tonya Salazar is a 23 year old female with a history of migraines. She returns today for follow-up. Continues on Amitriptyline  50 mg at bedtime. She reports that she has approximately 1-2 headaches a month.  She typically uses Tylenol but it does not always resolve the headache.  Has never tried Imitrex or Maxalt .  Nurtec was ordered for her in the past but was not approved through her insurance because she has not tried a triptan medication.  She returns today for an evaluation.   7/5/2022Ms. Salazar is an 23 year old female with a history of migraine headaches.  She returns today for follow-up.  At the last visit Dr. Buck started the patient on amitriptyline  50 mg at bedtime.  She reports that this is working well.  She has approximately 1 headache a week.  She typically can take ibuprofen  and the headache resolves within 15 minutes.  She was unable to get Nurtec as her insurance did not cover it.  She returns today for an evaluation.    (copied from Dr. Obie note) Tonya Salazar is an 23 year old female with an underlying medical history of allergic rhinitis, dysmenorrhea, perioral dermatitis, who reports an approximately 59-month history of recurrent headaches which are primarily located in the back of her head, left or right side.  She has  associated nausea, typically no vomiting and some light sensitivity, some sound sensitivity.  Headache is throbbing and ranges in severity, is currently almost daily.  She has been taking over-the-counter ibuprofen  and Excedrin, sometimes 3 at a time and almost daily.  She had side effects from both Zonegran and baclofen, baclofen made her very drowsy even half a pill of the 10 mg strength which she took in the evening,  she felt drowsy the next day and missed school.  She took Zonegran which was 25 mg strength and she was advised to titrate this up to 4 pills at night in weekly increments, she only took it for a week at the 25 mg strength and felt that her headaches became worse and that she was groggy from it.  She has since then discontinued these medications which were [facility]ita [facility].  She has no follow-up pending at this time or canceled it.  She has made an appointment for an eye examination which is pending for June.  At Dr. Cecil office her visual acuity was 20/20.   She has no family history of migraines.  She has no history of head injuries.  She does report school related stress, tries to sleep at least 7 hours, typically in bed by 11 and rise time is around 7.  On the weekends she has to get up at 6 to go to work.  She works as a Training and development officer.  She does not have any strenuous work.  In fifth grade she had an injury to her right knee as she was bicycling but did not have a head injury.  She tries to hydrate well, estimates that she drinks at least 60 ounces of water, limits her caffeine to 1 cup in the morning of coffee.  She does not drink alcohol or smoke.  She lives with her family which includes parents and 3 sisters, none with migraines.   I reviewed your office records.  She was seen in a televisit on 12/31/2020 and I reviewed the note.  She reported a 33-week history of worsening headaches with nearly daily occurrence.  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  She was started on magnesium, riboflavin and coenzyme Q 10, encouraged to hydrate well and not miss any food or snacks.  She was referred to the [facility] wellness center and was seen and March.  She was started on zonisamide and baclofen as needed.  I was able to review the office visit note from 01/21/1999 2010 when she saw Dr. Oneita.  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  Blood work was ordered including ESR, lipid panel, liver profile and BMP.  I do not have those blood test results available for my review.  REVIEW OF SYSTEMS: Out of a complete 14 system review of symptoms, the patient complains only of the following symptoms, and all other reviewed systems are negative.  ALLERGIES: No Known Allergies  HOME MEDICATIONS: Outpatient Medications Prior to Visit  Medication Sig Dispense Refill   albuterol  (PROVENTIL  HFA;VENTOLIN  HFA) 108 (90 BASE) MCG/ACT inhaler Inhale 1-2 puffs into the lungs every 6 (six) hours as needed for wheezing or shortness of breath.     amitriptyline  (ELAVIL ) 50 MG tablet TAKE 1 TABLET BY MOUTH EVERYDAY AT BEDTIME 90 tablet 0   co-enzyme Q-10 30 MG capsule Take  30 mg by mouth 3 (three) times daily.     desogestrel-ethinyl estradiol (APRI) 0.15-30 MG-MCG tablet      fluticasone (FLONASE) 50 MCG/ACT nasal spray Place into both nostrils.     fluticasone-salmeterol (ADVAIR HFA) 230-21 MCG/ACT inhaler Inhale 2 puffs into the lungs 2 (two) times daily.     levocetirizine (XYZAL) 5 MG tablet Take 2.5 mg by mouth every evening.     levofloxacin  (LEVAQUIN ) 500 MG tablet Take 1 tablet (500 mg total) by mouth daily. 7 tablet 0   MAGNESIUM PO Take by mouth.     montelukast (SINGULAIR) 5 MG chewable tablet Chew 5 mg by mouth at bedtime.     omeprazole (PRILOSEC) 20 MG capsule Take 20 mg by mouth daily.     predniSONE  (DELTASONE ) 20 MG tablet Take 1 tablet (20 mg total) by mouth daily with breakfast. 7 tablet 0   propranolol (INDERAL) 10 MG tablet Take 10 mg by mouth in the morning and at bedtime.     Pyridoxine HCl (VITAMIN B6 PO) Take by mouth.     No facility-administered medications prior to visit.    PAST MEDICAL HISTORY: Past Medical History:  Diagnosis Date   Allergic rhinitis    Asthma    Cough  variant asthma    Dysfunctional uterine bleeding    Dysmenorrhea    Dysmenorrhea in adolescent    Migraine headache    Personal history of COVID-19    Tension headache     PAST SURGICAL HISTORY: Past Surgical History:  Procedure Laterality Date   TEAR DUCT DILIATION     TYMPANOSTOMY TUBE PLACEMENT Bilateral     FAMILY HISTORY: Family History  Problem Relation Age of Onset   Diabetes Mother    High blood pressure Mother    High blood pressure Father    Migraines Neg Hx     SOCIAL HISTORY: Social History   Socioeconomic History   Marital status: Single    Spouse name: Not on file   Number of children: Not on file   Years of education: Not on file   Highest education level: Not on file  Occupational History   Not on file  Tobacco Use   Smoking status: Never   Smokeless tobacco: Never  Vaping Use   Vaping status: Never Used  Substance and Sexual Activity   Alcohol use: Yes    Alcohol/week: 3.0 standard drinks of alcohol    Types: 3 Glasses of wine per week    Comment: sometimes   Drug use: Never   Sexual activity: Not on file  Other Topics Concern   Not on file  Social History Narrative   Going into sophomore year at [employer] fall 2023   Right handed   Caffeine: 1 cup coffee/day   Social Salazar of Corporate investment banker Strain: Not on file  Food Insecurity: Not on file  Transportation Needs: Not on file  Physical Activity: Not on file  Stress: Not on file  Social Connections: Not on file  Intimate Partner Violence: Not on file      PHYSICAL EXAM Generalized: Well developed, in no acute distress   Neurological examination  Mentation: Alert oriented to time, place, history taking. Follows all commands speech and language fluent Cranial nerve II-XII: Facial symmetry noted.   DIAGNOSTIC DATA (LABS, IMAGING, TESTING) - I reviewed patient records, labs, notes, testing and imaging myself where available.    ASSESSMENT AND PLAN 23 y.o. year old  female  has a  past medical history of Allergic rhinitis, Asthma, Cough variant asthma, Dysfunctional uterine bleeding, Dysmenorrhea, Dysmenorrhea in adolescent, Migraine headache, Personal history of COVID-19, and Tension headache. here with:  Migraine headaches  -Will try reducing the dose of amitriptyline .  She will alternate between a half a tablet and 1 full tablet every other night for 2 weeks.  Then reduce to half a tablet nightly thereafter.  If she tolerates this well she will let me know and we can discuss further weaning instructions - Patient does report some dots in her vision with some of her headaches that are there very briefly.  I have asked her to pay attention to the symptoms.  As this could be an aura.  I did advise that if she is having migraine with aura she would need to consider coming off of birth control that contains estrogen as it could increase her stroke risk. - She is now on propranolol prescribed by cardiology.  We may be able to completely get her off amitriptyline  if propranolol is also beneficial for her migraines. - Follow-up in 6 months or sooner if needed    Duwaine Russell, MSN, NP-C 05/16/2024, 1:04 PM [facility] [address] [place], [place] [address] [phone] The patient's condition requires frequent monitoring and adjustments in the treatment plan, reflecting the ongoing complexity of care.  This provider is the continuing focal point for all needed services for this condition.

## 2024-06-15 DIAGNOSIS — Z130 Encounter for screening for diseases of the blood and blood-forming organs and certain disorders involving the immune mechanism: Secondary | ICD-10-CM | POA: Diagnosis not present

## 2024-06-15 DIAGNOSIS — Z01419 Encounter for gynecological examination (general) (routine) without abnormal findings: Secondary | ICD-10-CM | POA: Diagnosis not present

## 2024-06-15 DIAGNOSIS — Z113 Encounter for screening for infections with a predominantly sexual mode of transmission: Secondary | ICD-10-CM | POA: Diagnosis not present

## 2024-06-15 DIAGNOSIS — Z124 Encounter for screening for malignant neoplasm of cervix: Secondary | ICD-10-CM | POA: Diagnosis not present

## 2024-07-04 ENCOUNTER — Telehealth: Payer: Self-pay | Admitting: Adult Health

## 2024-07-04 DIAGNOSIS — G43019 Migraine without aura, intractable, without status migrainosus: Secondary | ICD-10-CM

## 2024-07-06 DIAGNOSIS — R87615 Unsatisfactory cytologic smear of cervix: Secondary | ICD-10-CM | POA: Diagnosis not present

## 2024-07-11 NOTE — Telephone Encounter (Signed)
 LVM for patient to call back to discuss dosage change . Per last office note     Alternate taking half a tablet of amitriptyline  with 1 full tablet of amitriptyline  every other night for 2 weeks. Example: Night 1: 1/2 tablet, night 2: 1 tablet, night 3: 1/2 tablet and so on for 2 weeks. Then reduce to half a tablet every night thereafter.

## 2024-07-17 DIAGNOSIS — F419 Anxiety disorder, unspecified: Secondary | ICD-10-CM | POA: Diagnosis not present

## 2024-07-19 NOTE — Telephone Encounter (Signed)
 Lvm 1st attempt by hf 07/19/24

## 2024-07-20 NOTE — Telephone Encounter (Signed)
 Pt returned call and I relayed the following information:  Good Afternoon Toshia , Megan,NP  states  called into pharmacy  10 mg tablet of amitriptyline . Can take 1.5 tablets for 2 weeks then reduce to 1 tablet at bedtime. If tolerating well can call for further weaning instructions.     Pt voiced gratitude and understanding of all discussed

## 2024-07-20 NOTE — Telephone Encounter (Signed)
 Lvm 2nd attempt by hf

## 2024-07-25 DIAGNOSIS — F331 Major depressive disorder, recurrent, moderate: Secondary | ICD-10-CM | POA: Diagnosis not present

## 2024-07-27 DIAGNOSIS — R21 Rash and other nonspecific skin eruption: Secondary | ICD-10-CM | POA: Diagnosis not present

## 2024-07-27 DIAGNOSIS — J4540 Moderate persistent asthma, uncomplicated: Secondary | ICD-10-CM | POA: Diagnosis not present

## 2024-07-27 DIAGNOSIS — H1045 Other chronic allergic conjunctivitis: Secondary | ICD-10-CM | POA: Diagnosis not present

## 2024-07-27 DIAGNOSIS — J309 Allergic rhinitis, unspecified: Secondary | ICD-10-CM | POA: Diagnosis not present

## 2024-08-09 DIAGNOSIS — F411 Generalized anxiety disorder: Secondary | ICD-10-CM | POA: Diagnosis not present

## 2024-08-09 DIAGNOSIS — Z1330 Encounter for screening examination for mental health and behavioral disorders, unspecified: Secondary | ICD-10-CM | POA: Diagnosis not present

## 2024-08-09 DIAGNOSIS — Z23 Encounter for immunization: Secondary | ICD-10-CM | POA: Diagnosis not present

## 2024-08-11 DIAGNOSIS — F331 Major depressive disorder, recurrent, moderate: Secondary | ICD-10-CM | POA: Diagnosis not present

## 2024-08-24 DIAGNOSIS — F331 Major depressive disorder, recurrent, moderate: Secondary | ICD-10-CM | POA: Diagnosis not present

## 2024-08-25 DIAGNOSIS — F331 Major depressive disorder, recurrent, moderate: Secondary | ICD-10-CM | POA: Diagnosis not present

## 2024-08-31 DIAGNOSIS — F419 Anxiety disorder, unspecified: Secondary | ICD-10-CM | POA: Diagnosis not present

## 2024-09-08 DIAGNOSIS — F331 Major depressive disorder, recurrent, moderate: Secondary | ICD-10-CM | POA: Diagnosis not present

## 2024-09-19 DIAGNOSIS — F331 Major depressive disorder, recurrent, moderate: Secondary | ICD-10-CM | POA: Diagnosis not present

## 2024-10-03 DIAGNOSIS — F411 Generalized anxiety disorder: Secondary | ICD-10-CM | POA: Diagnosis not present

## 2024-10-05 DIAGNOSIS — F1220 Cannabis dependence, uncomplicated: Secondary | ICD-10-CM | POA: Diagnosis not present

## 2024-10-19 DIAGNOSIS — F331 Major depressive disorder, recurrent, moderate: Secondary | ICD-10-CM | POA: Diagnosis not present

## 2024-11-16 ENCOUNTER — Telehealth: Payer: Self-pay | Admitting: Adult Health

## 2024-11-16 NOTE — Telephone Encounter (Signed)
 Patient said have stop taking the medication and would like to cancel appointment

## 2024-11-21 ENCOUNTER — Telehealth: Admitting: Adult Health
